# Patient Record
Sex: Male | Born: 1980 | Race: Black or African American | Hispanic: No | Marital: Single | Smoking: Never smoker
Health system: Southern US, Community
[De-identification: ages and names within clinical notes are randomized; demographics above are authoritative.]

---
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
## 2003-02-27 ENCOUNTER — Emergency Department (HOSPITAL_COMMUNITY): Admission: EM | Admit: 2003-02-27 | Discharge: 2003-02-28 | Payer: Self-pay | Admitting: Emergency Medicine

## 2004-02-06 ENCOUNTER — Emergency Department (HOSPITAL_COMMUNITY): Admission: EM | Admit: 2004-02-06 | Discharge: 2004-02-06 | Payer: Self-pay | Admitting: Emergency Medicine

## 2004-07-20 ENCOUNTER — Emergency Department (HOSPITAL_COMMUNITY): Admission: EM | Admit: 2004-07-20 | Discharge: 2004-07-21 | Payer: Self-pay | Admitting: Emergency Medicine

## 2005-01-14 ENCOUNTER — Emergency Department (HOSPITAL_COMMUNITY): Admission: EM | Admit: 2005-01-14 | Discharge: 2005-01-14 | Payer: Self-pay | Admitting: Emergency Medicine

## 2011-10-25 ENCOUNTER — Encounter (HOSPITAL_BASED_OUTPATIENT_CLINIC_OR_DEPARTMENT_OTHER): Payer: Self-pay | Admitting: Emergency Medicine

## 2011-10-25 ENCOUNTER — Emergency Department (HOSPITAL_BASED_OUTPATIENT_CLINIC_OR_DEPARTMENT_OTHER)
Admission: EM | Admit: 2011-10-25 | Discharge: 2011-10-26 | Disposition: A | Discharge status: Home / Self Care | Payer: Self-pay | Attending: Emergency Medicine | Admitting: Emergency Medicine

## 2011-10-25 DIAGNOSIS — IMO0002 Reserved for concepts with insufficient information to code with codable children: Secondary | ICD-10-CM | POA: Insufficient documentation

## 2011-10-25 DIAGNOSIS — F411 Generalized anxiety disorder: Secondary | ICD-10-CM | POA: Insufficient documentation

## 2011-10-25 DIAGNOSIS — R51 Headache: Secondary | ICD-10-CM | POA: Insufficient documentation

## 2011-10-25 DIAGNOSIS — S0101XA Laceration without foreign body of scalp, initial encounter: Secondary | ICD-10-CM

## 2011-10-25 DIAGNOSIS — S0100XA Unspecified open wound of scalp, initial encounter: Secondary | ICD-10-CM | POA: Insufficient documentation

## 2011-10-25 MED ORDER — LIDOCAINE HCL 2 % IJ SOLN
INTRAMUSCULAR | Status: AC
  Filled 2011-10-25: qty 1

## 2011-10-25 NOTE — ED Notes (Signed)
Pt was jumping in the garage and hit his head on the garage door apparatus in ceiling.  Approx 13 inch lac.  Denies LOC.  Ambulatory.  Bleeding controlled on arrival to Ed.

## 2011-10-26 MED ORDER — IBUPROFEN 800 MG PO TABS
800.0000 mg | ORAL_TABLET | Freq: Once | ORAL | Status: AC
  Administered 2011-10-26: 800 mg via ORAL

## 2011-10-26 MED ORDER — HYDROCODONE-ACETAMINOPHEN 5-325 MG PO TABS: 1.0000 | ORAL_TABLET | Freq: Four times a day (QID) | ORAL | Status: AC | PRN

## 2011-10-26 MED ORDER — HYDROCODONE-ACETAMINOPHEN 5-325 MG PO TABS: 1.0000 | ORAL_TABLET | Freq: Once | ORAL | Status: DC

## 2011-10-26 MED ORDER — IBUPROFEN 800 MG PO TABS
ORAL_TABLET | ORAL | Status: AC
  Filled 2011-10-26: qty 1

## 2011-10-26 NOTE — Discharge Instructions (Signed)
Laceration Care, Adult A laceration is a cut or lesion that goes through all layers of the skin and into the tissue just beneath the skin. TREATMENT  Some lacerations may not require closure. Some lacerations may not be able to be closed due to an increased risk of infection. It is important to see your caregiver as soon as possible after an injury to minimize the risk of infection and maximize the opportunity for successful closure. If closure is appropriate, pain medicines may be given, if needed. The wound will be cleaned to help prevent infection. Your caregiver will use stitches (sutures), staples, wound glue (adhesive), or skin adhesive strips to repair the laceration. These tools bring the skin edges together to allow for faster healing and a better cosmetic outcome. However, all wounds will heal with a scar. Once the wound has healed, scarring can be minimized by covering the wound with sunscreen during the day for 1 full year. HOME CARE INSTRUCTIONS  For sutures or staples:  Keep the wound clean and dry.   If you were given a bandage (dressing), you should change it at least once a day. Also, change the dressing if it becomes wet or dirty, or as directed by your caregiver.   Wash the wound with soap and water 2 times a day. Rinse the wound off with water to remove all soap. Pat the wound dry with a clean towel.   After cleaning, apply a thin layer of the antibiotic ointment as recommended by your caregiver. This will help prevent infection and keep the dressing from sticking.   You may shower as usual after the first 24 hours. Do not soak the wound in water until the sutures are removed.   Only take over-the-counter or prescription medicines for pain, discomfort, or fever as directed by your caregiver.   Get your sutures or staples removed as directed by your caregiver.  You may need a tetanus shot if:  You cannot remember when you had your last tetanus shot.   You have never had a  tetanus shot.  If you get a tetanus shot, your arm may swell, get red, and feel warm to the touch. This is common and not a problem. If you need a tetanus shot and you choose not to have one, there is a rare chance of getting tetanus. Sickness from tetanus can be serious. SEEK MEDICAL CARE IF:   You have redness, swelling, or increasing pain in the wound.   You see a red line that goes away from the wound.   You have yellowish-white fluid (pus) coming from the wound.   You have a fever.   You notice a bad smell coming from the wound or dressing.   Your wound breaks open before or after sutures have been removed.   You notice something coming out of the wound such as wood or glass.   Your wound is on your hand or foot and you cannot move a finger or toe.  SEEK IMMEDIATE MEDICAL CARE IF:   Your pain is not controlled with prescribed medicine.   You have severe swelling around the wound causing pain and numbness or a change in color in your arm, hand, leg, or foot.   Your wound splits open and starts bleeding.   You have worsening numbness, weakness, or loss of function of any joint around or beyond the wound.   You develop painful lumps near the wound or on the skin anywhere on your body.  MAKE  SURE YOU:   Understand these instructions.   Will watch your condition.   Will get help right away if you are not doing well or get worse.  Document Released: 06/19/2005 Document Revised: 06/08/2011 Document Reviewed: 12/13/2010 [facility] Patient Information 2012 [place], [place].

## 2011-10-26 NOTE — ED Provider Notes (Signed)
History     CSN: [id number]  Arrival date & time 10/25/11  2253   First MD Initiated Contact with Patient 10/26/11 0107      Chief Complaint  Patient presents with  . Head Laceration    (Consider location/radiation/quality/duration/timing/severity/associated sxs/prior treatment) HPI Is a 31 year old black male who was jumping up and describes to clear some spider webs and struck his head on the drug store opener. He has a laceration across the top of his scalp. There was heavy bleeding but this has resolved with pressure. The wound was irrigated by nursing staff prior to my evaluation. There's moderate pain associated with it. He denies neck pain. He denies loss of consciousness. He denies nausea or vomiting. He states his tetanus shots are up to date.  History reviewed. No pertinent past medical history.  History reviewed. No pertinent past surgical history.  No family history on file.  History  Substance Use Topics  . Smoking status: Never Smoker   . Smokeless tobacco: Not on file  . Alcohol Use: No      Review of Systems  All other systems reviewed and are negative.    Allergies  Review of patient's allergies indicates no known allergies.  Home Medications   Current Outpatient Rx  Name Route Sig Dispense Refill  . IBUPROFEN 200 MG PO TABS Oral Take 400 mg by mouth every 6 (six) hours as needed. Patient used this medication for headaches.      BP 142/95  Pulse 97  Temp(Src) 98.4 F (36.9 C) (Oral)  Resp 16  Ht 5\' 7"  (1.702 m)  Wt 165 lb (74.844 kg)  BMI 25.84 kg/m2  SpO2 98%  Physical Exam General: Well-developed, well-nourished male in no acute distress; appearance consistent with age of record HENT: normocephalic, scalp laceration Eyes: pupils equal round and reactive to light; extraocular muscles intact Neck: supple; nontender Heart: regular rate and rhythm Lungs: Normal respiratory effort and excursion Abdomen: soft; nondistended Extremities: No  deformity; full range of motion Neurologic: Awake, alert and oriented; motor function intact in all extremities and symmetric; no facial droop Skin: Warm and dry Psychiatric: Anxious    ED Course  Procedures (including critical care time)  LACERATION REPAIR Performed by: Malicia Blasdel L Authorized by: Hanley Seamen Consent: Verbal consent obtained. Risks and benefits: risks, benefits and alternatives were discussed Consent given by: patient Patient identity confirmed: provided demographic data Prepped and Draped in normal sterile fashion Wound explored  Laceration Location: Top of scalp  Laceration Length: 6.5 cm  No Foreign Bodies seen or palpated  Anesthesia: local infiltration  Local anesthetic: None (at patient's request due to fear of needles)  Irrigation method: syringe Amount of cleaning: standard  Skin closure:  staples  Number of sutures:  6  Patient tolerance: Patient tolerated the procedure well with no immediate complications.    MDM          Hanley Seamen, MD 10/26/11 [phone]

## 2011-11-04 ENCOUNTER — Encounter (HOSPITAL_BASED_OUTPATIENT_CLINIC_OR_DEPARTMENT_OTHER): Payer: Self-pay | Admitting: *Deleted

## 2011-11-04 ENCOUNTER — Emergency Department (HOSPITAL_BASED_OUTPATIENT_CLINIC_OR_DEPARTMENT_OTHER)
Admission: EM | Admit: 2011-11-04 | Discharge: 2011-11-04 | Disposition: A | Discharge status: Home / Self Care | Payer: Self-pay | Attending: Emergency Medicine | Admitting: Emergency Medicine

## 2011-11-04 DIAGNOSIS — Z4802 Encounter for removal of sutures: Secondary | ICD-10-CM | POA: Insufficient documentation

## 2011-11-04 NOTE — Discharge Instructions (Signed)
You should return to the ER if you develop pain, fever, drainage of pus or spreading redness at site of wound. Staple Removal Care After The staples used to close your skin have been removed. The wound needs continued care so it can heal completely and without problems. The care described here will need to be done for another 5-10 days unless your caregiver advises otherwise.  HOME CARE INSTRUCTIONS   Keep wound site dry and clean.   If skin adhesive strips were applied after the staples were removed, they will begin to peel off in a few days. If they remain after fourteen days, they may be peeled off and discarded.   If you still have a dressing, change it at least once a day or as instructed by your caregiver. If the bandage sticks, soak it off with warm water. Pat dry with a clean towel. Look for signs of infection (see below).   Reapply cream or ointment according to your caregiver's instruction. This will help prevent infection and keep the bandage from sticking. Use of a non-stick material over the wound and under the dressing or wrap will also help keep the bandage from sticking.   If the bandage becomes wet, dirty or develops a foul smell, change it as soon as possible.   New scars become sunburned easily. Use sunscreens with protection factor (SPF) of at least 15 when out in the sun.   Only take over-the-counter or prescription medicines for pain, discomfort or fever as directed by your caregiver.  SEEK IMMEDIATE MEDICAL CARE IF:   There is redness, swelling or increasing pain in the wound.   Pus is coming from the wound.   An unexplained oral temperature above 102 F (38.9 C) develops.   You notice a foul smell coming from the wound or dressing.   There is a breaking open of the suture line (edges not staying together) of the wound edges after staples have been removed.  Document Released: 06/01/2008 Document Revised: 06/08/2011 Document Reviewed: 06/01/2008 [facility] Patient  Information 2012 [place], [place].
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
## 2011-11-04 NOTE — ED Notes (Signed)
Pt seen here 4-24. Here for staple removal.

## 2011-11-04 NOTE — ED Provider Notes (Signed)
Medical screening examination/treatment/procedure(s) were performed by non-physician practitioner and as supervising physician I was immediately available for consultation/collaboration.   Geoffery Lyons, MD 11/04/11 1450

## 2014-08-01 ENCOUNTER — Emergency Department (HOSPITAL_COMMUNITY)
Admission: EM | Admit: 2014-08-01 | Discharge: 2014-08-01 | Disposition: A | Discharge status: Home / Self Care | Payer: No Typology Code available for payment source | Attending: Emergency Medicine | Admitting: Emergency Medicine

## 2014-08-01 ENCOUNTER — Encounter (HOSPITAL_COMMUNITY): Payer: Self-pay | Admitting: Emergency Medicine

## 2014-08-01 ENCOUNTER — Emergency Department (HOSPITAL_COMMUNITY): Payer: No Typology Code available for payment source

## 2014-08-01 DIAGNOSIS — Y998 Other external cause status: Secondary | ICD-10-CM | POA: Insufficient documentation

## 2014-08-01 DIAGNOSIS — Y9389 Activity, other specified: Secondary | ICD-10-CM | POA: Diagnosis not present

## 2014-08-01 DIAGNOSIS — Y92410 Unspecified street and highway as the place of occurrence of the external cause: Secondary | ICD-10-CM | POA: Insufficient documentation

## 2014-08-01 DIAGNOSIS — S0990XA Unspecified injury of head, initial encounter: Secondary | ICD-10-CM | POA: Diagnosis not present

## 2014-08-01 DIAGNOSIS — S199XXA Unspecified injury of neck, initial encounter: Secondary | ICD-10-CM | POA: Diagnosis present

## 2014-08-01 DIAGNOSIS — S161XXA Strain of muscle, fascia and tendon at neck level, initial encounter: Secondary | ICD-10-CM | POA: Diagnosis not present

## 2014-08-01 MED ORDER — CYCLOBENZAPRINE HCL 10 MG PO TABS: 10.0000 mg | ORAL_TABLET | Freq: Two times a day (BID) | ORAL | Status: DC | PRN

## 2014-08-01 MED ORDER — IBUPROFEN 800 MG PO TABS: 800.0000 mg | ORAL_TABLET | Freq: Three times a day (TID) | ORAL | Status: DC

## 2014-08-01 MED ORDER — ACETAMINOPHEN 325 MG PO TABS
650.0000 mg | ORAL_TABLET | Freq: Once | ORAL | Status: AC
  Administered 2014-08-01: 650 mg via ORAL
  Filled 2014-08-01: qty 2

## 2014-08-01 MED ORDER — HYDROCODONE-ACETAMINOPHEN 5-325 MG PO TABS
1.0000 | ORAL_TABLET | Freq: Once | ORAL | Status: DC
  Filled 2014-08-01: qty 1
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
## 2014-08-01 NOTE — ED Notes (Signed)
Patient transported to X-ray 
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
## 2014-08-10 ENCOUNTER — Emergency Department (HOSPITAL_BASED_OUTPATIENT_CLINIC_OR_DEPARTMENT_OTHER)
Admission: EM | Admit: 2014-08-10 | Discharge: 2014-08-10 | Disposition: A | Discharge status: Home / Self Care | Payer: No Typology Code available for payment source | Attending: Emergency Medicine | Admitting: Emergency Medicine

## 2014-08-10 ENCOUNTER — Encounter (HOSPITAL_BASED_OUTPATIENT_CLINIC_OR_DEPARTMENT_OTHER): Payer: Self-pay

## 2014-08-10 ENCOUNTER — Encounter: Payer: Self-pay | Admitting: Medical

## 2014-08-10 DIAGNOSIS — G8911 Acute pain due to trauma: Secondary | ICD-10-CM | POA: Diagnosis not present

## 2014-08-10 DIAGNOSIS — M542 Cervicalgia: Secondary | ICD-10-CM | POA: Diagnosis present

## 2014-08-10 DIAGNOSIS — M549 Dorsalgia, unspecified: Secondary | ICD-10-CM | POA: Insufficient documentation

## 2014-08-10 DIAGNOSIS — M62838 Other muscle spasm: Secondary | ICD-10-CM | POA: Insufficient documentation

## 2014-08-10 MED ORDER — IBUPROFEN 800 MG PO TABS: 800.0000 mg | ORAL_TABLET | Freq: Three times a day (TID) | ORAL | Status: DC

## 2014-08-10 MED ORDER — CYCLOBENZAPRINE HCL 10 MG PO TABS: 10.0000 mg | ORAL_TABLET | Freq: Two times a day (BID) | ORAL | Status: DC | PRN

## 2014-08-10 NOTE — Discharge Instructions (Signed)
Muscle Cramps and Spasms °Muscle cramps and spasms occur when a muscle or muscles tighten and you have no control over this tightening (involuntary muscle contraction). They are a common problem and can develop in any muscle. The most common place is in the calf muscles of the leg. Both muscle cramps and muscle spasms are involuntary muscle contractions, but they also have differences:  °· Muscle cramps are sporadic and painful. They may last a few seconds to a quarter of an hour. Muscle cramps are often more forceful and last longer than muscle spasms. °· Muscle spasms may or may not be painful. They may also last just a few seconds or much longer. °CAUSES  °It is uncommon for cramps or spasms to be due to a serious underlying problem. In many cases, the cause of cramps or spasms is unknown. Some common causes are:  °· Overexertion.   °· Overuse from repetitive motions (doing the same thing over and over).   °· Remaining in a certain position for a long period of time.   °· Improper preparation, form, or technique while performing a sport or activity.   °· Dehydration.   °· Injury.   °· Side effects of some medicines.   °· Abnormally low levels of the salts and ions in your blood (electrolytes), especially potassium and calcium. This could happen if you are taking water pills (diuretics) or you are pregnant.   °Some underlying medical problems can make it more likely to develop cramps or spasms. These include, but are not limited to:  °· Diabetes.   °· Parkinson disease.   °· Hormone disorders, such as thyroid problems.   °· Alcohol abuse.   °· Diseases specific to muscles, joints, and bones.   °· Blood vessel disease where not enough blood is getting to the muscles.   °HOME CARE INSTRUCTIONS  °· Stay well hydrated. Drink enough water and fluids to keep your urine clear or pale yellow. °· It may be helpful to massage, stretch, and relax the affected muscle. °· For tight or tense muscles, use a warm towel, heating  pad, or hot shower water directed to the affected area. °· If you are sore or have pain after a cramp or spasm, applying ice to the affected area may relieve discomfort. °¨ Put ice in a plastic bag. °¨ Place a towel between your skin and the bag. °¨ Leave the ice on for 15-20 minutes, 03-04 times a day. °· Medicines used to treat a known cause of cramps or spasms may help reduce their frequency or severity. Only take over-the-counter or prescription medicines as directed by your caregiver. °SEEK MEDICAL CARE IF:  °Your cramps or spasms get more severe, more frequent, or do not improve over time.  °MAKE SURE YOU:  °· Understand these instructions. °· Will watch your condition. °· Will get help right away if you are not doing well or get worse. °Document Released: 12/09/2001 Document Revised: 10/14/2012 Document Reviewed: 06/05/2012 °ExitCare® Patient Information ©2015 [employer]. This information is not intended to replace advice given to you by your health care provider. Make sure you discuss any questions you have with your health care provider. ° ° °Emergency Department Resource Guide °1) Find a Doctor and Pay Out of Pocket °Although you won't have to find out who is covered by your insurance plan, it is a good idea to ask around and get recommendations. You will then need to call the office and see if the doctor you have chosen will accept you as a new patient and what types of options they offer   for patients who are self-pay. Some doctors offer discounts or will set up payment plans for their patients who do not have insurance, but you will need to ask so you aren't surprised when you get to your appointment. ° °2) Contact Your Local Health Department °Not all health departments have doctors that can see patients for sick visits, but many do, so it is worth a call to see if yours does. If you don't know where your local health department is, you can check in your phone book. The CDC also has a tool to help  you locate your state's health department, and many state websites also have listings of all of their local health departments. ° °3) Find a Walk-in Clinic °If your illness is not likely to be very severe or complicated, you may want to try a walk in clinic. These are popping up all over the country in pharmacies, drugstores, and shopping centers. They're usually staffed by nurse practitioners or physician assistants that have been trained to treat common illnesses and complaints. They're usually fairly quick and inexpensive. However, if you have serious medical issues or chronic medical problems, these are probably not your best option. ° °No Primary Care Doctor: °- Call [facility] at  832-8000 - they can help you locate a primary care doctor that  accepts your insurance, provides certain services, etc. °- Physician Referral Service- 1-800-533-3463 ° °Chronic Pain Problems: °Organization         Address  Phone   Notes  °[facility]  (336) 297-2271 Patients need to be referred by their primary care doctor.  ° °Medication Assistance: °Organization         Address  Phone   Notes  °[place] [facility] [address] °[place], [place] [address] (336) 641-8030 --Must be a resident of [place] °-- Must have NO insurance coverage whatsoever (no Medicaid/ Medicare, etc.) °-- The pt. MUST have a primary care doctor that directs their care regularly and follows them in the community °  °[facility]  (866) 331-1348   °[employer]  (888) 892-1162   ° °Agencies that provide inexpensive medical care: °Organization         Address  Phone   Notes  °[facility]  (336) 832-8035   °[facility]    (336) 832-7272   °[facility] [address] °[place], [place] [address] (336) 832-4777   °[facility] [address], °[place] (336) 271-4999   °[facility]    (336) 373-0678   °[facility]    (336) 272-1050   °[facility] ° [address], [place] Phone:  (336) 832-4444, Fax:  (336) 832-4440 Hours of Operation:  9 am - 6 pm, M-F.  Also accepts Medicaid/Medicare and self-pay.  ° [facility] ° [address], [place] Phone: (336) 832-3150, Fax: (336) 832-3151. Hours of Operation:  8:30 am - 5:30 pm, M-F.  Also accepts Medicaid and self-pay.  °[facility] [place] [address], [place] Phone: (336) 878-6027   °[facility] [address], [place], [place] (336)723-1848, Ext. 123 Mondays & Thursdays: 7-9 AM.  First 15 patients are seen on a first come, first serve basis. °  ° °Medicaid-accepting [place] Providers: ° °Organization         Address  Phone   Notes  °[facility]   [address], [place] (336) 641-2100 Also accepts self-pay patients.  °[facility] [address], [place] ° (336) 856-9996   °[facility] [address], [place] (336) 288-8857   °[facility] [address] [place] [address], [place] (336) 299-7000   °Veita Bland [address], [place]  ° (336) 373-1557 Only accepts [place] Access Medicaid patients after they have their name applied to their card.  ° °Self-Pay (no insurance) in [place]: ° °Organization         Address  Phone   Notes  °Sickle Cell Patients, [facility] [address], [place] (336) 832-1970   °[facility] [address], [place] (336) 832-4400   °[facility] ° [address], [place] (336) 992-4800   °[facility]/Dr. Osei-Bonsu ° [address] [place] [address], [place] or [address], [place] (336) 841-8500 Phone number for both [place] and [place] locations is the same.  °[facility] [address],  [place] (336) 299-0000   °[facility] [address] [place] [address], [place] or [address] (336) 852-7530 °(336) 878-2260   °[facility] [address], [place] (336) 350-1642, phone; (336) 294-5005, fax Sees patients 1st and 3rd Saturday of every month.  Must not qualify for public or private insurance (i.e. Medicaid, Medicare, [place] Health Choice, Veterans' Benefits) • Household income should be no more than 200% of the poverty level •The clinic cannot treat you if you are pregnant or think you are pregnant • Sexually transmitted diseases are not treated at the clinic.  ° ° °Dental Care: °Organization         Address  Phone  Notes  °[facility] [facility] [address], [place] (336) 641-6152 Accepts children up to age 21 who are enrolled in Medicaid or [place] Health Choice; pregnant women with a Medicaid card; and children who have applied for Medicaid or [place] Health Choice, but were declined, whose parents can pay a reduced fee at time of service.  °[facility] [place]  [address], [place] (336) 641-7733 Accepts children up to age 21 who are enrolled in Medicaid or [place] Health Choice; pregnant women with a Medicaid card; and children who have applied for Medicaid or [place] Health Choice, but were declined, whose parents can pay a reduced fee at time of service.  °[facility] ° [address], [place] (336) 641-4533 Patients are seen by appointment only. Walk-ins are not accepted. [facility] will see patients 18 years of age and older. °Monday - Tuesday (8am-5pm) °Most Wednesdays (8:30-5pm) °$30 per visit, cash only  °[facility] ° [address], [place] (336) 641-4533 Patients are seen by appointment only. Walk-ins are not accepted. [facility] will see patients 18 years of age and older. °One Wednesday Evening  (Monthly: Volunteer Based).  $30 per visit, cash only  °[facility]  (919) 537-3737 for adults; Children under age 4, call Graduate Pediatric Dentistry at (919) [phone]. Children aged 4-14, please call (919) [phone] to request a pediatric application. ° Dental services are provided in all areas of dental care including fillings, crowns and bridges, complete and partial dentures, implants, gum   treatment, root canals, and extractions. Preventive care is also provided. Treatment is provided to both adults and children. °Patients are selected via a lottery and there is often a waiting list. °  °[facility] [address], °[place] ° (336) 763-8833 [email or web address] °  °[facility] [address], [place], [place] (336)723-1848, Ext. 123 Second and Fourth Thursday of each month, opens at 6:30 AM; Clinic ends at 9 AM.  Patients are seen on a first-come first-served basis, and a limited number are seen during each clinic.  ° °[facility] ° [address], [place], [place] (336) 723-7904   Eligibility Requirements °You must have lived in [place], [place], or [place] counties for at least the last three months. °  You cannot be eligible for state or federal sponsored healthcare insurance, including Veterans Administration, Medicaid, or Medicare. °  You generally cannot be eligible for healthcare insurance through your employer.  °  How to apply: °Eligibility screenings are held every Tuesday and Wednesday afternoon from 1:00 pm until 4:00 pm. You do not need an appointment for the interview!  °[facility] [address], [place], [place] 336-631-2330   °[facility]  336-342-8273   °[facility]  336-703-3100   °[facility]  336-570-6415   ° °Behavioral Health Resources in the Community: °Intensive Outpatient Programs °Organization         Address  Phone  Notes  °[place]  [facility] [address], [place], [place] 336-878-6098   °[facility] [address], [place], [place] 336-832-9800   °[facility] [address], [place], [place] ° 336-882-2125   °[facility] [address],  °[place], [place] 1-800-853-5163 or 336-641-4981   °Substance Abuse Resources °Organization         Address  Phone  Notes  °[facility]  336-882-2125   °[facility]  336-784-9470   °[facility]  336-285-9073   °[facility]  336-845-3988   °Residential & Outpatient Substance Abuse Program  1-800-659-3381   °Psychological Services °Organization         Address  Phone  Notes  ° Health  336- 832-9600   °[facility]  336- 378-7881   °[facility] [address], [place] 1-800-853-5163 or 336-641-4981   ° °Mobile Crisis Teams °Organization         Address  Phone  Notes  °[facility], Mobile Crisis Care Unit  1-877-626-1772   °Assertive °[facility] ° [address] [place], [place] 336-834-9664   °Sharon DeEsch [address] °[place] 336-554-5454   ° °Self-Help/Support Groups °Organization         Address  Phone             Notes  °[facility] [employer] - variety of support groups  336- 373-1402 Call for more information  °Narcotics Anonymous (NA), [facility] [address], °[place]  2 meetings at this location  ° °Residential Treatment Programs °Organization         Address  Phone  Notes  °[facility] [address],    ° [place]  1-866-801-8205   °[facility] ° [address], [place],  704-293-8524   °[facility] [address], [place] 336-845-3988 Admissions: 8am-3pm M-F  °[facility] [address]    °[place], [place] 336-841-1104   °[facility] [address],  [place], [place] 336-379-7146   °[facility] [address]  °[place], [place] 336-285-9073   °[facility] Intensive Outpatient [address], [place], [place] 336-852-3033   °[facility] [address]  °[place], [place] 1-877-615-2722 or 336-784-9470   °[facility] [address] [place], [place] 336-227-7417 Accepts Medicaid  °[facility] [address]  °[place] 1-800-659-3381 Substance Abuse/Addiction Treatment  ° °[place] Behavioral Health Resources °Organization         Address  Phone  Notes  °[facility]  (888) 581-9988   °Julie Brannon, PhD [address] [place], [place]   (336) 349-5553 or (336) 951-0000   °[facility]   [address] °[place], [place] (336) 349-4454   °[facility] [address], [place], [place] (336) 342-8316 Insurance/Medicaid/sponsorship through [facility]  °[facility] [address]                                    [place], [place] (336) 342-8316 Therapy/tele-psych/case  °[facility] [address]  ° [place], [place] (336) 349-2233    °Dr. Arfeen  (336) 349-4544   °[facility] [place]  [employer] [place] [facility] 1) [address], [place] °2) [address], [place] °3)  [address], [place] (336) 349-3220 °(336) 342-7768 ° °(336) 342-8140   °[place] [facility] (336) 342-1394 or (336) 342-3537 (After Hours)    ° ° ° °
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
## 2014-08-10 NOTE — Progress Notes (Signed)
This encounter was created in error - please disregard.
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
## 2017-07-29 ENCOUNTER — Emergency Department (HOSPITAL_COMMUNITY): Admission: EM | Admit: 2017-07-29 | Discharge: 2017-07-29 | Discharge status: Against Medical Advice | Payer: Self-pay

## 2017-07-29 ENCOUNTER — Emergency Department (HOSPITAL_COMMUNITY)
Admission: EM | Admit: 2017-07-29 | Discharge: 2017-07-30 | Disposition: A | Discharge status: Home / Self Care | Payer: Self-pay | Attending: Emergency Medicine | Admitting: Emergency Medicine

## 2017-07-29 ENCOUNTER — Encounter (HOSPITAL_COMMUNITY): Payer: Self-pay | Admitting: Nurse Practitioner

## 2017-07-29 ENCOUNTER — Emergency Department (HOSPITAL_COMMUNITY): Payer: Self-pay

## 2017-07-29 DIAGNOSIS — N134 Hydroureter: Secondary | ICD-10-CM | POA: Insufficient documentation

## 2017-07-29 DIAGNOSIS — N201 Calculus of ureter: Secondary | ICD-10-CM | POA: Insufficient documentation

## 2017-07-29 DIAGNOSIS — N130 Hydronephrosis with ureteropelvic junction obstruction: Secondary | ICD-10-CM | POA: Insufficient documentation

## 2017-07-29 DIAGNOSIS — N50819 Testicular pain, unspecified: Secondary | ICD-10-CM

## 2017-07-29 DIAGNOSIS — N132 Hydronephrosis with renal and ureteral calculous obstruction: Secondary | ICD-10-CM

## 2017-07-29 LAB — CBC WITH DIFFERENTIAL/PLATELET
BASOS PCT: 0 %
Basophils Absolute: 0 10*3/uL (ref 0.0–0.1)
Eosinophils Absolute: 0 10*3/uL (ref 0.0–0.7)
Eosinophils Relative: 0 %
HEMATOCRIT: 46.3 % (ref 39.0–52.0)
HEMOGLOBIN: 15.9 g/dL (ref 13.0–17.0)
LYMPHS ABS: 0.9 10*3/uL (ref 0.7–4.0)
Lymphocytes Relative: 7 %
MCH: 29.9 pg (ref 26.0–34.0)
MCHC: 34.3 g/dL (ref 30.0–36.0)
MCV: 87.2 fL (ref 78.0–100.0)
Monocytes Absolute: 0.5 10*3/uL (ref 0.1–1.0)
Monocytes Relative: 3 %
NEUTROS PCT: 90 %
Neutro Abs: 12.4 10*3/uL — ABNORMAL HIGH (ref 1.7–7.7)
Platelets: 230 10*3/uL (ref 150–400)
RBC: 5.31 MIL/uL (ref 4.22–5.81)
RDW: 13.7 % (ref 11.5–15.5)
WBC: 13.8 10*3/uL — AB (ref 4.0–10.5)

## 2017-07-29 LAB — URINALYSIS, COMPLETE (UACMP) WITH MICROSCOPIC
BACTERIA UA: NONE SEEN
Bilirubin Urine: NEGATIVE
Glucose, UA: NEGATIVE mg/dL
Ketones, ur: 20 mg/dL — AB
Leukocytes, UA: NEGATIVE
NITRITE: NEGATIVE
Protein, ur: 30 mg/dL — AB
SPECIFIC GRAVITY, URINE: 1.024 (ref 1.005–1.030)
SQUAMOUS EPITHELIAL / LPF: NONE SEEN
pH: 7 (ref 5.0–8.0)

## 2017-07-29 LAB — COMPREHENSIVE METABOLIC PANEL
ALT: 25 U/L (ref 17–63)
AST: 29 U/L (ref 15–41)
Albumin: 4.7 g/dL (ref 3.5–5.0)
Alkaline Phosphatase: 85 U/L (ref 38–126)
Anion gap: 10 (ref 5–15)
BUN: 16 mg/dL (ref 6–20)
CO2: 24 mmol/L (ref 22–32)
CREATININE: 1.65 mg/dL — AB (ref 0.61–1.24)
Calcium: 9.6 mg/dL (ref 8.9–10.3)
Chloride: 106 mmol/L (ref 101–111)
GFR calc Af Amer: >60 mL/min (ref >60)
GFR calc non Af Amer: 52 mL/min — ABNORMAL LOW (ref >60)
Glucose, Bld: 136 mg/dL — ABNORMAL HIGH (ref 65–99)
Potassium: 4.6 mmol/L (ref 3.5–5.1)
Sodium: 140 mmol/L (ref 135–145)
TOTAL PROTEIN: 8.1 g/dL (ref 6.5–8.1)
Total Bilirubin: 0.4 mg/dL (ref 0.3–1.2)

## 2017-07-29 LAB — LIPASE, BLOOD: Lipase: 23 U/L (ref 11–51)

## 2017-07-29 MED ORDER — KETOROLAC TROMETHAMINE 30 MG/ML IJ SOLN: 30.0000 mg | Freq: Once | INTRAMUSCULAR | Status: DC

## 2017-07-29 MED ORDER — IOPAMIDOL (ISOVUE-300) INJECTION 61%
100.0000 mL | Freq: Once | INTRAVENOUS | Status: AC | PRN
  Administered 2017-07-29: 100 mL via INTRAVENOUS

## 2017-07-29 MED ORDER — HYDROMORPHONE HCL 1 MG/ML IJ SOLN
1.0000 mg | Freq: Once | INTRAMUSCULAR | Status: DC
  Filled 2017-07-29: qty 1

## 2017-07-29 MED ORDER — HYDROMORPHONE HCL 1 MG/ML IJ SOLN
1.0000 mg | Freq: Once | INTRAMUSCULAR | Status: AC
  Administered 2017-07-29: 1 mg via INTRAVENOUS

## 2017-07-29 MED ORDER — FENTANYL CITRATE (PF) 100 MCG/2ML IJ SOLN
50.0000 ug | Freq: Once | INTRAMUSCULAR | Status: DC
  Filled 2017-07-29: qty 2

## 2017-07-29 MED ORDER — KETOROLAC TROMETHAMINE 30 MG/ML IJ SOLN
30.0000 mg | Freq: Once | INTRAMUSCULAR | Status: AC
  Administered 2017-07-30: 30 mg via INTRAVENOUS
  Filled 2017-07-29: qty 1

## 2017-07-29 MED ORDER — ONDANSETRON HCL 4 MG/2ML IJ SOLN
4.0000 mg | Freq: Once | INTRAMUSCULAR | Status: AC
  Administered 2017-07-29: 4 mg via INTRAVENOUS
  Filled 2017-07-29: qty 2

## 2017-07-29 MED ORDER — FENTANYL CITRATE (PF) 100 MCG/2ML IJ SOLN
100.0000 ug | Freq: Once | INTRAMUSCULAR | Status: AC
Start: 2017-07-29 — End: 2017-07-29
  Administered 2017-07-29: 100 ug via INTRAVENOUS
  Filled 2017-07-29: qty 2

## 2017-07-29 MED ORDER — HYDROMORPHONE HCL 1 MG/ML IJ SOLN
0.5000 mg | Freq: Once | INTRAMUSCULAR | Status: AC
  Administered 2017-07-29: 0.5 mg via INTRAVENOUS
  Filled 2017-07-29: qty 1

## 2017-07-29 MED ORDER — IOPAMIDOL (ISOVUE-300) INJECTION 61%
INTRAVENOUS | Status: AC
  Filled 2017-07-29: qty 100

## 2017-07-29 NOTE — ED Notes (Signed)
Scrotal ultrasound complete

## 2017-07-29 NOTE — ED Notes (Signed)
Pt still unable to provide urine specimen 

## 2017-07-29 NOTE — ED Notes (Signed)
Patient transported to CT 

## 2017-07-29 NOTE — ED Triage Notes (Signed)
Pt is presenting with some pain induced distress, pointing to his genitals, he states he "feels as if he is on fire." States the pain is moving to his lower stomach pointing to his suprapubic region. Denies dysuria, endorses recent sexual activity/intercourse, states he first noticed the pain a week ago, went away but came back today in a worse state.

## 2017-07-29 NOTE — ED Provider Notes (Signed)
Kidney stone Slightly elevated Cr Dilaudid without improvement Fentanyl  Scrotal US pending for testicular tenderness  Plan: review US, reassess pain management  Pain is improved. Patient is resting quietly. VSS. Scrotal US negative. Discussed discharge home with pain management and Zofran for nausea. Return precautions outlined. Urology referral provided.    Darrell Gonzalez, Darrell Shadoan, PA-C 07/30/17 0105    Raeford RazorKohut, Stephen, MD 08/06/17 [phone]

## 2017-07-29 NOTE — ED Notes (Signed)
Pt was having increasing pain again.  Pt was on all 4s in the bed and vomited x1.  Pain medication given.  Pt states that while earlier the pain was 10/10 it is now 4/10.  Pt still appears quite uncomfortable at this time

## 2017-07-29 NOTE — ED Notes (Signed)
Pt has attempted to void but has been unsuccessful

## 2017-07-29 NOTE — ED Notes (Signed)
Patient called to triage 4. Went in room to triage, could not find patient.

## 2017-07-29 NOTE — ED Triage Notes (Signed)
Patient here via EMS with complaints of abdominal pain radiating down into testicles. EMS states that patient was throwing shoes in the back of the truck and trying to unbuckle self. Patient laying on the floor in triage and screaming.

## 2017-07-29 NOTE — ED Provider Notes (Signed)
[facility]-EMERGENCY DEPT Provider Note   CSN: [id number] Arrival date & time: 07/29/17  1840     History   Chief Complaint No chief complaint on file.   HPI Darrell Gonzalez is a 37 y.o. male who presents for severe testicle pain.  Patient states that about 2 weeks ago he had sudden onset of severe pain in both of his testicles lasting approximately 1 hour.  It called EMS but by the time they arrived it had resolved and did not return until this morning around 11 AM.  He had again sudden onset of severe bilateral testicle pain radiating into his suprapubic region.  He states that it is the worst pain he is ever had in his life and he had associated diaphoresis.  He states it is both of his testicles.  He denies any penile discharge, urinary symptoms.  He has bloating and distention in his lower abdomen with severe pain.  He denies flank pain, fevers, shortness of breath.  He has no previous history of kidney stones.  HPI  History reviewed. No pertinent past medical history.  There are no active problems to display for this patient.   History reviewed. No pertinent surgical history.     Home Medications    Prior to Admission medications   Medication Sig Start Date End Date Taking? Authorizing Provider  Aspirin-Salicylamide-Caffeine (BC HEADACHE POWDER PO) Take 1 packet by mouth as needed.   Yes [provider]  cyclobenzaprine (FLEXERIL) 10 MG tablet Take 1 tablet (10 mg total) by mouth 2 (two) times daily as needed for muscle spasms. Patient not taking: Reported on 07/29/2017 08/10/14   Elwin Mocha, MD  ibuprofen (ADVIL,MOTRIN) 800 MG tablet Take 1 tablet (800 mg total) by mouth 3 (three) times daily. Patient not taking: Reported on 07/29/2017 08/10/14   Elwin Mocha, MD    Family History History reviewed. No pertinent family history.  Social History Social History   Tobacco Use  . Smoking status: Never Smoker  Substance Use Topics  . Alcohol  use: No  . Drug use: No     Allergies   Patient has no known allergies.   Review of Systems Review of Systems Ten systems reviewed and are negative for acute change, except as noted in the HPI.   Physical Exam Updated Vital Signs BP 116/80 (BP Location: Left Arm)   Pulse 92   Temp 98.3 F (36.8 C) (Oral)   Resp 20   SpO2 99%   Physical Exam  Constitutional: He appears well-developed and well-nourished. No distress.  Appears to be in severe pain  HENT:  Head: Normocephalic and atraumatic.  Eyes: Conjunctivae and EOM are normal. Pupils are equal, round, and reactive to light. No scleral icterus.  Neck: Normal range of motion. Neck supple.  Cardiovascular: Normal rate, regular rhythm and normal heart sounds.  Pulmonary/Chest: Effort normal and breath sounds normal. No respiratory distress.  Abdominal: Soft. He exhibits distension. There is tenderness.  Lower abdomen is distended up to the umbilicus, firm and tender to palpation.  Genitourinary: Penis normal.  Genitourinary Comments: Male anatomy circumcised, no testicular swelling, bilateral testicles are exquisitely tender to palpation.  Penile discharge or lesions  Musculoskeletal: He exhibits no edema.  Neurological: He is alert.  Skin: Skin is warm. He is diaphoretic.  Psychiatric: His behavior is normal.  Nursing note and vitals reviewed.    ED Treatments / Results  Labs (all labs ordered are listed, but only abnormal results are displayed)  Labs Reviewed  CBC WITH DIFFERENTIAL/PLATELET - Abnormal; Notable for the following components:      Result Value   WBC 13.8 (*)    Neutro Abs 12.4 (*)    All other components within normal limits  COMPREHENSIVE METABOLIC PANEL  LIPASE, BLOOD  URINALYSIS, COMPLETE (UACMP) WITH MICROSCOPIC    EKG  EKG Interpretation None       Radiology No results found.  Procedures Procedures (including critical care time)  Medications Ordered in ED Medications  fentaNYL  (SUBLIMAZE) injection 100 mcg (100 mcg Intravenous Given 07/29/17 2041)  ondansetron (ZOFRAN) injection 4 mg (4 mg Intravenous Given 07/29/17 2042)  HYDROmorphone (DILAUDID) injection 1 mg (1 mg Intravenous Given 07/29/17 2054)     Initial Impression / Assessment and Plan / ED Course  I have reviewed the triage vital signs and the nursing notes.  Pertinent labs & imaging results that were available during my care of the patient were reviewed by me and considered in my medical decision making (see chart for details).  Clinical Course as of Jul 29 2340  Wynelle LinkSun Jul 29, 2017  2257 Creatinine noted to be elevvated  Creatinine: (!) 1.65 [AH]  2301 WBC: (!) 13.8 [AH]  2332 I spoke with Dr. Juleen ChinaKohut who feels an aching give Toradol despite mild elevation of his creatinine which is likely transient.  I reviewed his urinalysis which does not show any evidence of infection.  Patient's pain poorly controlled at this time.  [AH]    Clinical Course User Index [AH] Arthor CaptainHarris, Kaya Klausing, PA-C    Patient with kidney stone.  Patient pain is still poorly controlled at this time.  I have given sign out to PA up still who will assume care of the patient.  Feel he may be discharged if his pain is better controlled and may follow-up for repeat labs to check his creatinine.  Final Clinical Impressions(s) / ED Diagnoses   Final diagnoses:  Testicular pain    ED Discharge Orders    None       Arthor CaptainHarris, Kasheem Toner, PA-C 07/29/17 2343    Raeford RazorKohut, Stephen, MD 07/29/17 2356

## 2017-07-30 MED ORDER — IBUPROFEN 600 MG PO TABS: 600.0000 mg | ORAL_TABLET | Freq: Four times a day (QID) | ORAL | 0 refills | Status: DC | PRN

## 2017-07-30 MED ORDER — OXYCODONE-ACETAMINOPHEN 5-325 MG PO TABS: 1.0000 | ORAL_TABLET | ORAL | 0 refills | Status: DC | PRN

## 2017-07-30 MED ORDER — ONDANSETRON HCL 4 MG PO TABS: 4.0000 mg | ORAL_TABLET | Freq: Four times a day (QID) | ORAL | 0 refills | Status: DC

## 2018-08-16 ENCOUNTER — Encounter (HOSPITAL_COMMUNITY): Payer: Self-pay | Admitting: Emergency Medicine

## 2018-08-16 ENCOUNTER — Other Ambulatory Visit: Payer: Self-pay

## 2018-08-16 ENCOUNTER — Emergency Department (HOSPITAL_COMMUNITY)
Admission: EM | Admit: 2018-08-16 | Discharge: 2018-08-17 | Disposition: A | Discharge status: Home / Self Care | Payer: Self-pay | Attending: Emergency Medicine | Admitting: Emergency Medicine

## 2018-08-16 DIAGNOSIS — M542 Cervicalgia: Secondary | ICD-10-CM | POA: Insufficient documentation

## 2018-08-16 DIAGNOSIS — Z79899 Other long term (current) drug therapy: Secondary | ICD-10-CM | POA: Insufficient documentation

## 2018-08-16 DIAGNOSIS — Y998 Other external cause status: Secondary | ICD-10-CM | POA: Insufficient documentation

## 2018-08-16 DIAGNOSIS — Y939 Activity, unspecified: Secondary | ICD-10-CM | POA: Insufficient documentation

## 2018-08-16 DIAGNOSIS — M5489 Other dorsalgia: Secondary | ICD-10-CM | POA: Insufficient documentation

## 2018-08-16 DIAGNOSIS — Y92410 Unspecified street and highway as the place of occurrence of the external cause: Secondary | ICD-10-CM | POA: Insufficient documentation
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
## 2018-08-17 ENCOUNTER — Other Ambulatory Visit: Payer: Self-pay

## 2018-08-17 MED ORDER — CYCLOBENZAPRINE HCL 10 MG PO TABS: 10.0000 mg | ORAL_TABLET | Freq: Two times a day (BID) | ORAL | 0 refills | Status: DC | PRN

## 2018-08-17 MED ORDER — IBUPROFEN 600 MG PO TABS: 600.0000 mg | ORAL_TABLET | Freq: Four times a day (QID) | ORAL | 0 refills | Status: DC | PRN

## 2018-08-17 NOTE — ED Notes (Signed)
E-signature not available, verbalized understanding of DC instructions. 
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
## 2019-10-07 ENCOUNTER — Other Ambulatory Visit: Payer: Self-pay

## 2019-10-07 ENCOUNTER — Encounter (HOSPITAL_COMMUNITY): Payer: Self-pay

## 2019-10-07 ENCOUNTER — Ambulatory Visit (INDEPENDENT_AMBULATORY_CARE_PROVIDER_SITE_OTHER): Payer: Self-pay

## 2019-10-07 ENCOUNTER — Ambulatory Visit (HOSPITAL_COMMUNITY)
Admission: EM | Admit: 2019-10-07 | Discharge: 2019-10-07 | Disposition: A | Discharge status: Home / Self Care | Payer: Self-pay | Attending: Family Medicine | Admitting: Family Medicine

## 2019-10-07 DIAGNOSIS — S6991XA Unspecified injury of right wrist, hand and finger(s), initial encounter: Secondary | ICD-10-CM

## 2019-10-07 DIAGNOSIS — M79644 Pain in right finger(s): Secondary | ICD-10-CM

## 2019-10-07 MED ORDER — IBUPROFEN 800 MG PO TABS: 800.0000 mg | ORAL_TABLET | Freq: Three times a day (TID) | ORAL | 0 refills | Status: AC

## 2019-10-07 NOTE — Discharge Instructions (Signed)
Take the ibuprofen 3 times a day for the next 3-4 days. Then as needed  Use the ace warp if needed.  If not improving over the next 2 weeks please return for reevaluation  If swelling and pain become worse please return.
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
## 2019-11-23 ENCOUNTER — Emergency Department (HOSPITAL_COMMUNITY): Payer: Self-pay

## 2019-11-23 ENCOUNTER — Emergency Department (HOSPITAL_COMMUNITY)
Admission: EM | Admit: 2019-11-23 | Discharge: 2019-11-23 | Disposition: A | Discharge status: Home / Self Care | Payer: Self-pay | Attending: Emergency Medicine | Admitting: Emergency Medicine

## 2019-11-23 ENCOUNTER — Other Ambulatory Visit: Payer: Self-pay

## 2019-11-23 DIAGNOSIS — Y93F9 Activity, other caregiving: Secondary | ICD-10-CM | POA: Insufficient documentation

## 2019-11-23 DIAGNOSIS — S62316A Displaced fracture of base of fifth metacarpal bone, right hand, initial encounter for closed fracture: Secondary | ICD-10-CM | POA: Insufficient documentation

## 2019-11-23 DIAGNOSIS — S62314A Displaced fracture of base of fourth metacarpal bone, right hand, initial encounter for closed fracture: Secondary | ICD-10-CM | POA: Insufficient documentation

## 2019-11-23 DIAGNOSIS — W208XXA Other cause of strike by thrown, projected or falling object, initial encounter: Secondary | ICD-10-CM | POA: Insufficient documentation

## 2019-11-23 DIAGNOSIS — R110 Nausea: Secondary | ICD-10-CM | POA: Insufficient documentation

## 2019-11-23 DIAGNOSIS — Y92019 Unspecified place in single-family (private) house as the place of occurrence of the external cause: Secondary | ICD-10-CM | POA: Insufficient documentation

## 2019-11-23 DIAGNOSIS — Y990 Civilian activity done for income or pay: Secondary | ICD-10-CM | POA: Insufficient documentation

## 2019-11-23 MED ORDER — OXYCODONE-ACETAMINOPHEN 5-325 MG PO TABS
1.0000 | ORAL_TABLET | Freq: Once | ORAL | Status: AC
  Administered 2019-11-23: 1 via ORAL
  Filled 2019-11-23: qty 1

## 2019-11-23 MED ORDER — OXYCODONE-ACETAMINOPHEN 5-325 MG PO TABS: 1.0000 | ORAL_TABLET | Freq: Four times a day (QID) | ORAL | 0 refills | Status: DC | PRN

## 2019-11-23 MED ORDER — ONDANSETRON 8 MG PO TBDP
8.0000 mg | ORAL_TABLET | Freq: Once | ORAL | Status: AC
  Administered 2019-11-23: 8 mg via ORAL
  Filled 2019-11-23: qty 1
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
## 2019-11-23 NOTE — Discharge Instructions (Addendum)
Keep your splint on clean and dry.  Elevate.  Ice.  Follow-up with Dr. Eulah Pont tomorrow at his office at 8:30 in the morning.  Pain medication as needed.  Return to the emergency department for any worsening or concerning symptoms

## 2019-11-23 NOTE — ED Notes (Signed)
Delay in discharge as pt would now like worker's comp info filed and a urine screen done. Registration made aware.
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
## 2019-11-23 NOTE — Progress Notes (Signed)
Orthopedic Tech Progress Note Patient Details:  Darrell Gonzalez 05/07/1981 [id number]  Ortho Devices Type of Ortho Device: Cotton web roll, Ulna gutter splint, Ace wrap Ortho Device/Splint Location: RLU Ortho Device/Splint Interventions: Ordered, Application   Post Interventions Patient Tolerated: Fair Instructions Provided: Care of device   [employer] 11/23/2019, 3:07 PM

## 2019-11-26 ENCOUNTER — Encounter (HOSPITAL_BASED_OUTPATIENT_CLINIC_OR_DEPARTMENT_OTHER): Payer: Self-pay | Admitting: Orthopedic Surgery

## 2019-11-26 ENCOUNTER — Other Ambulatory Visit (HOSPITAL_COMMUNITY)
Admission: RE | Admit: 2019-11-26 | Discharge: 2019-11-26 | Disposition: A | Discharge status: Home / Self Care | Payer: HRSA Program | Source: Ambulatory Visit | Attending: Orthopedic Surgery | Admitting: Orthopedic Surgery

## 2019-11-26 ENCOUNTER — Other Ambulatory Visit: Payer: Self-pay

## 2019-11-26 DIAGNOSIS — Z01812 Encounter for preprocedural laboratory examination: Secondary | ICD-10-CM | POA: Diagnosis present

## 2019-11-26 DIAGNOSIS — Z20822 Contact with and (suspected) exposure to covid-19: Secondary | ICD-10-CM | POA: Diagnosis not present

## 2019-11-26 LAB — SARS CORONAVIRUS 2 (TAT 6-24 HRS): SARS Coronavirus 2: NEGATIVE

## 2019-11-27 ENCOUNTER — Other Ambulatory Visit: Payer: Self-pay

## 2019-11-27 ENCOUNTER — Encounter (HOSPITAL_BASED_OUTPATIENT_CLINIC_OR_DEPARTMENT_OTHER): Payer: Self-pay | Admitting: Anesthesiology

## 2019-11-27 ENCOUNTER — Encounter (HOSPITAL_BASED_OUTPATIENT_CLINIC_OR_DEPARTMENT_OTHER)
Admission: RE | Disposition: A | Discharge status: Home / Self Care | Payer: Self-pay | Source: Ambulatory Visit | Attending: Orthopedic Surgery

## 2019-11-27 ENCOUNTER — Ambulatory Visit (HOSPITAL_BASED_OUTPATIENT_CLINIC_OR_DEPARTMENT_OTHER)
Admission: RE | Admit: 2019-11-27 | Discharge: 2019-11-27 | Disposition: A | Discharge status: Home / Self Care | Payer: Self-pay | Source: Ambulatory Visit | Attending: Orthopedic Surgery | Admitting: Orthopedic Surgery

## 2019-11-27 ENCOUNTER — Encounter (HOSPITAL_BASED_OUTPATIENT_CLINIC_OR_DEPARTMENT_OTHER): Payer: Self-pay | Admitting: Orthopedic Surgery

## 2019-11-27 DIAGNOSIS — S62316A Displaced fracture of base of fifth metacarpal bone, right hand, initial encounter for closed fracture: Secondary | ICD-10-CM

## 2019-11-27 DIAGNOSIS — S62304A Unspecified fracture of fourth metacarpal bone, right hand, initial encounter for closed fracture: Secondary | ICD-10-CM | POA: Insufficient documentation

## 2019-11-27 DIAGNOSIS — S62141A Displaced fracture of body of hamate [unciform] bone, right wrist, initial encounter for closed fracture: Secondary | ICD-10-CM | POA: Insufficient documentation

## 2019-11-27 DIAGNOSIS — X58XXXA Exposure to other specified factors, initial encounter: Secondary | ICD-10-CM | POA: Insufficient documentation

## 2019-11-27 DIAGNOSIS — S62306A Unspecified fracture of fifth metacarpal bone, right hand, initial encounter for closed fracture: Secondary | ICD-10-CM | POA: Insufficient documentation

## 2019-11-27 DIAGNOSIS — S62314A Displaced fracture of base of fourth metacarpal bone, right hand, initial encounter for closed fracture: Secondary | ICD-10-CM

## 2019-11-27 HISTORY — PX: CLOSED REDUCTION METACARPAL WITH PERCUTANEOUS PINNING: SHX5613

## 2019-11-27 SURGERY — CLOSED REDUCTION, FRACTURE, METACARPAL BONE, WITH PERCUTANEOUS PINNING
Anesthesia: Monitor Anesthesia Care | Site: Finger | Laterality: Right

## 2019-11-27 MED ORDER — ONDANSETRON HCL 4 MG/2ML IJ SOLN
INTRAMUSCULAR | Status: AC
  Filled 2019-11-27: qty 8

## 2019-11-27 MED ORDER — CLONIDINE HCL (ANALGESIA) 100 MCG/ML EP SOLN
EPIDURAL | Status: AC | PRN
  Administered 2019-11-27: 100 ug

## 2019-11-27 MED ORDER — KETOROLAC TROMETHAMINE 30 MG/ML IJ SOLN
INTRAMUSCULAR | Status: AC
  Filled 2019-11-27: qty 1

## 2019-11-27 MED ORDER — ONDANSETRON HCL 4 MG PO TABS: 4.0000 mg | ORAL_TABLET | Freq: Three times a day (TID) | ORAL | 0 refills | Status: AC | PRN

## 2019-11-27 MED ORDER — LIDOCAINE 2% (20 MG/ML) 5 ML SYRINGE
INTRAMUSCULAR | Status: AC
  Filled 2019-11-27: qty 20

## 2019-11-27 MED ORDER — GABAPENTIN 300 MG PO CAPS: 300.0000 mg | ORAL_CAPSULE | Freq: Two times a day (BID) | ORAL | 0 refills | Status: AC

## 2019-11-27 MED ORDER — PROPOFOL 10 MG/ML IV BOLUS
INTRAVENOUS | Status: AC
  Filled 2019-11-27: qty 20

## 2019-11-27 MED ORDER — MIDAZOLAM HCL 2 MG/2ML IJ SOLN
2.0000 mg | Freq: Once | INTRAMUSCULAR | Status: AC
  Administered 2019-11-27: 2 mg via INTRAVENOUS

## 2019-11-27 MED ORDER — PHENYLEPHRINE 40 MCG/ML (10ML) SYRINGE FOR IV PUSH (FOR BLOOD PRESSURE SUPPORT)
PREFILLED_SYRINGE | INTRAVENOUS | Status: AC
  Filled 2019-11-27: qty 10

## 2019-11-27 MED ORDER — ACETAMINOPHEN 500 MG PO TABS: 1000.0000 mg | ORAL_TABLET | Freq: Three times a day (TID) | ORAL | 0 refills | Status: AC

## 2019-11-27 MED ORDER — ROPIVACAINE HCL 7.5 MG/ML IJ SOLN
INTRAMUSCULAR | Status: AC | PRN
  Administered 2019-11-27 (×4): 5 mL via PERINEURAL

## 2019-11-27 MED ORDER — ACETAMINOPHEN 500 MG PO TABS
ORAL_TABLET | ORAL | Status: AC
  Filled 2019-11-27: qty 2

## 2019-11-27 MED ORDER — SUGAMMADEX SODIUM 500 MG/5ML IV SOLN
INTRAVENOUS | Status: AC
  Filled 2019-11-27: qty 5

## 2019-11-27 MED ORDER — OXYCODONE HCL 5 MG PO TABS: 5.0000 mg | ORAL_TABLET | ORAL | 0 refills | Status: AC | PRN

## 2019-11-27 MED ORDER — FENTANYL CITRATE (PF) 100 MCG/2ML IJ SOLN
100.0000 ug | Freq: Once | INTRAMUSCULAR | Status: AC
  Administered 2019-11-27: 100 ug via INTRAVENOUS

## 2019-11-27 MED ORDER — FENTANYL CITRATE (PF) 100 MCG/2ML IJ SOLN
INTRAMUSCULAR | Status: AC
  Filled 2019-11-27: qty 2

## 2019-11-27 MED ORDER — ACETAMINOPHEN 500 MG PO TABS
1000.0000 mg | ORAL_TABLET | Freq: Once | ORAL | Status: AC
  Administered 2019-11-27: 1000 mg via ORAL

## 2019-11-27 MED ORDER — METHOCARBAMOL 500 MG PO TABS: 500.0000 mg | ORAL_TABLET | Freq: Three times a day (TID) | ORAL | 0 refills | Status: DC | PRN

## 2019-11-27 MED ORDER — LACTATED RINGERS IV SOLN: INTRAVENOUS | Status: DC

## 2019-11-27 MED ORDER — EPHEDRINE 5 MG/ML INJ
INTRAVENOUS | Status: AC
  Filled 2019-11-27: qty 10

## 2019-11-27 MED ORDER — ROCURONIUM BROMIDE 10 MG/ML (PF) SYRINGE
PREFILLED_SYRINGE | INTRAVENOUS | Status: AC
  Filled 2019-11-27: qty 20

## 2019-11-27 MED ORDER — MIDAZOLAM HCL 2 MG/2ML IJ SOLN
INTRAMUSCULAR | Status: AC
  Filled 2019-11-27: qty 2

## 2019-11-27 MED ORDER — DEXAMETHASONE SODIUM PHOSPHATE 10 MG/ML IJ SOLN
INTRAMUSCULAR | Status: AC
  Filled 2019-11-27: qty 4

## 2019-11-27 MED ORDER — CEFAZOLIN SODIUM-DEXTROSE 2-4 GM/100ML-% IV SOLN: 2.0000 g | INTRAVENOUS | Status: DC

## 2019-11-27 MED ORDER — CHLORHEXIDINE GLUCONATE 4 % EX LIQD: 60.0000 mL | Freq: Once | CUTANEOUS | Status: DC

## 2019-11-27 MED ORDER — CEFAZOLIN SODIUM-DEXTROSE 2-4 GM/100ML-% IV SOLN
INTRAVENOUS | Status: AC
  Filled 2019-11-27: qty 100

## 2019-11-27 SURGICAL SUPPLY — 61 items
BLADE SURG 15 STRL LF DISP TIS (BLADE) ×1 IMPLANT
BLADE SURG 15 STRL SS (BLADE) ×3
BNDG ELASTIC 4X5.8 VLCR STR LF (GAUZE/BANDAGES/DRESSINGS) ×3 IMPLANT
BNDG ESMARK 4X9 LF (GAUZE/BANDAGES/DRESSINGS) IMPLANT
CHLORAPREP W/TINT 26 (MISCELLANEOUS) ×3 IMPLANT
CLOSURE STERI-STRIP 1/2X4 (GAUZE/BANDAGES/DRESSINGS)
CLSR STERI-STRIP ANTIMIC 1/2X4 (GAUZE/BANDAGES/DRESSINGS) IMPLANT
CORD BIPOLAR FORCEPS 12FT (ELECTRODE) IMPLANT
COVER BACK TABLE 60X90IN (DRAPES) ×3 IMPLANT
COVER WAND RF STERILE (DRAPES) IMPLANT
CUFF TOURN SGL QUICK 18X4 (TOURNIQUET CUFF) ×3 IMPLANT
CUFF TOURN SGL QUICK 24 (TOURNIQUET CUFF)
CUFF TRNQT CYL 24X4X16.5-23 (TOURNIQUET CUFF) IMPLANT
DECANTER SPIKE VIAL GLASS SM (MISCELLANEOUS) IMPLANT
DRAPE EXTREMITY T 121X128X90 (DISPOSABLE) ×3 IMPLANT
DRAPE IMP U-DRAPE 54X76 (DRAPES) ×3 IMPLANT
DRAPE OEC MINIVIEW 54X84 (DRAPES) ×3 IMPLANT
DRAPE SURG 17X23 STRL (DRAPES) IMPLANT
DRSG EMULSION OIL 3X3 NADH (GAUZE/BANDAGES/DRESSINGS) ×3 IMPLANT
GAUZE SPONGE 4X4 12PLY STRL (GAUZE/BANDAGES/DRESSINGS) ×3 IMPLANT
GAUZE XEROFORM 1X8 LF (GAUZE/BANDAGES/DRESSINGS) ×3 IMPLANT
GLOVE BIO SURGEON STRL SZ7.5 (GLOVE) ×6 IMPLANT
GLOVE BIOGEL PI IND STRL 7.0 (GLOVE) ×2 IMPLANT
GLOVE BIOGEL PI IND STRL 8 (GLOVE) ×3 IMPLANT
GLOVE BIOGEL PI INDICATOR 7.0 (GLOVE) ×4
GLOVE BIOGEL PI INDICATOR 8 (GLOVE) ×6
GLOVE ECLIPSE 6.5 STRL STRAW (GLOVE) ×3 IMPLANT
GLOVE ECLIPSE 8.0 STRL XLNG CF (GLOVE) ×3 IMPLANT
GOWN STRL REUS W/ TWL LRG LVL3 (GOWN DISPOSABLE) ×3 IMPLANT
GOWN STRL REUS W/ TWL XL LVL3 (GOWN DISPOSABLE) ×1 IMPLANT
GOWN STRL REUS W/TWL LRG LVL3 (GOWN DISPOSABLE) ×9
GOWN STRL REUS W/TWL XL LVL3 (GOWN DISPOSABLE) ×3
NEEDLE HYPO 25X1 1.5 SAFETY (NEEDLE) ×3 IMPLANT
NS IRRIG 1000ML POUR BTL (IV SOLUTION) ×3 IMPLANT
PAD CAST 4YDX4 CTTN HI CHSV (CAST SUPPLIES) ×1 IMPLANT
PADDING CAST ABS 4INX4YD NS (CAST SUPPLIES) ×2
PADDING CAST ABS COTTON 4X4 ST (CAST SUPPLIES) ×1 IMPLANT
PADDING CAST COTTON 4X4 STRL (CAST SUPPLIES) ×3
SET BASIN DAY SURGERY F.S. (CUSTOM PROCEDURE TRAY) ×3 IMPLANT
SLEEVE SCD COMPRESS KNEE MED (MISCELLANEOUS) IMPLANT
SPLINT FAST PLASTER 5X30 (CAST SUPPLIES)
SPLINT PLASTER CAST FAST 5X30 (CAST SUPPLIES) IMPLANT
SPLINT PLASTER CAST XFAST 3X15 (CAST SUPPLIES) IMPLANT
SPLINT PLASTER CAST XFAST 4X15 (CAST SUPPLIES) ×15 IMPLANT
SPLINT PLASTER XTRA FAST SET 4 (CAST SUPPLIES) ×30
SPLINT PLASTER XTRA FASTSET 3X (CAST SUPPLIES)
SUCTION FRAZIER HANDLE 10FR (MISCELLANEOUS)
SUCTION TUBE FRAZIER 10FR DISP (MISCELLANEOUS) IMPLANT
SUT ETHILON 3 0 PS 1 (SUTURE) IMPLANT
SUT MON AB 2-0 CT1 36 (SUTURE) ×3 IMPLANT
SUT MON AB 4-0 PC3 18 (SUTURE) IMPLANT
SUT PROLENE 3 0 PS 2 (SUTURE) IMPLANT
SUT VIC AB 2-0 SH 27 (SUTURE)
SUT VIC AB 2-0 SH 27XBRD (SUTURE) IMPLANT
SUT VIC AB 3-0 FS2 27 (SUTURE) IMPLANT
SYR BULB EAR ULCER 3OZ GRN STR (SYRINGE) IMPLANT
SYR CONTROL 10ML LL (SYRINGE) ×3 IMPLANT
TOWEL GREEN STERILE FF (TOWEL DISPOSABLE) ×3 IMPLANT
TUBE CONNECTING 20'X1/4 (TUBING)
TUBE CONNECTING 20X1/4 (TUBING) IMPLANT
UNDERPAD 30X36 HEAVY ABSORB (UNDERPADS AND DIAPERS) ×3 IMPLANT

## 2019-11-27 NOTE — Anesthesia Postprocedure Evaluation (Signed)
Anesthesia Post Note  Patient: NOLEN LINDAMOOD  Procedure(s) Performed: CLOSED REDUCTION METACARPAL WITH PERCUTANEOUS PINNING RIGHT 4TH AND 5TH (Right Finger)     Patient location during evaluation: Phase II Anesthesia Type: Regional and MAC Level of consciousness: awake and sedated Pain management: pain level controlled Vital Signs Assessment: post-procedure vital signs reviewed and stable Respiratory status: spontaneous breathing Cardiovascular status: stable Postop Assessment: no apparent nausea or vomiting Anesthetic complications: no    Last Vitals:  Vitals:   11/27/19 1300 11/27/19 1450  BP: 102/78 115/79  Pulse: 71 66  Resp: 14 16  Temp:  36.4 C  SpO2: 100% 100%    Last Pain:  Vitals:   11/27/19 1450  TempSrc:   PainSc: 0-No pain   Pain Goal: Patients Stated Pain Goal: 3 (11/27/19 1145)                 Huston Foley

## 2019-11-27 NOTE — H&P (Signed)
ORTHOPAEDIC CONSULTATION  REQUESTING PHYSICIAN: Renette Butters, MD  Chief Complaint: right 4th and 5th CMC dislocation. Hamate fracture, fractures of 4th and 5th metacarpal  HPI: Darrell Gonzalez is a 39 y.o. male who complains of  Injury to his R hand  History reviewed. No pertinent past medical history. History reviewed. No pertinent surgical history. Social History   Socioeconomic History  . Marital status: Single    Spouse name: Not on file  . Number of children: Not on file  . Years of education: Not on file  . Highest education level: Not on file  Occupational History  . Not on file  Tobacco Use  . Smoking status: Never Smoker  . Smokeless tobacco: Never Used  Substance and Sexual Activity  . Alcohol use: No  . Drug use: No  . Sexual activity: Yes  Other Topics Concern  . Not on file  Social History Narrative  . Not on file   Social Determinants of Health   Financial Resource Strain:   . Difficulty of Paying Living Expenses:   Food Insecurity:   . Worried About Charity fundraiser in the Last Year:   . Arboriculturist in the Last Year:   Transportation Needs:   . Film/video editor (Medical):   Marland Kitchen Lack of Transportation (Non-Medical):   Physical Activity:   . Days of Exercise per Week:   . Minutes of Exercise per Session:   Stress:   . Feeling of Stress :   Social Connections:   . Frequency of Communication with Friends and Family:   . Frequency of Social Gatherings with Friends and Family:   . Attends Religious Services:   . Active Member of Clubs or Organizations:   . Attends Archivist Meetings:   Marland Kitchen Marital Status:    Family History  Family history unknown: Yes   No Known Allergies Prior to Admission medications   Medication Sig Start Date End Date Taking? Authorizing Provider  ibuprofen (ADVIL) 800 MG tablet Take 1 tablet (800 mg total) by mouth 3 (three) times daily. 10/07/19  Yes Darr, Marguerita Beards, PA-C  oxyCODONE-acetaminophen  (PERCOCET/ROXICET) 5-325 MG tablet Take 1-2 tablets by mouth every 6 (six) hours as needed for severe pain. 11/23/19  Yes Hayden Rasmussen, MD  acetaminophen (TYLENOL) 500 MG tablet Take 2 tablets (1,000 mg total) by mouth every 8 (eight) hours for 10 days. For Pain. 11/27/19 12/07/19  Prudencio Burly III, PA-C  gabapentin (NEURONTIN) 300 MG capsule Take 1 capsule (300 mg total) by mouth 2 (two) times daily for 14 days. For 2 weeks post op for pain. 11/27/19 12/11/19  Prudencio Burly III, PA-C  methocarbamol (ROBAXIN) 500 MG tablet Take 1 tablet (500 mg total) by mouth every 8 (eight) hours as needed for muscle spasms. 11/27/19   Prudencio Burly III, PA-C  ondansetron (ZOFRAN) 4 MG tablet Take 1 tablet (4 mg total) by mouth every 8 (eight) hours as needed for nausea or vomiting. 11/27/19   Prudencio Burly III, PA-C   No results found.  Positive ROS: All other systems have been reviewed and were otherwise negative with the exception of those mentioned in the HPI and as above.  Labs cbc No results for input(s): WBC, HGB, HCT, PLT in the last 72 hours.  Labs inflam No results for input(s): CRP in the last 72 hours.  Invalid input(s): ESR  Labs coag No results for input(s): INR, PTT in  the last 72 hours.  Invalid input(s): PT  No results for input(s): NA, K, CL, CO2, GLUCOSE, BUN, CREATININE, CALCIUM in the last 72 hours.  Physical Exam: Vitals:   11/27/19 1255 11/27/19 1300  BP:  102/78  Pulse: 68 71  Resp: 13 14  Temp:    SpO2: 100% 100%   General: Alert, no acute distress Cardiovascular: No pedal edema Respiratory: No cyanosis, no use of accessory musculature GI: No organomegaly, abdomen is soft and non-tender Skin: No lesions in the area of chief complaint other than those listed below in MSK exam.  Neurologic: Sensation intact distally save for the below mentioned MSK exam Psychiatric: Patient is competent for consent with normal mood and  affect Lymphatic: No axillary or cervical lymphadenopathy  MUSCULOSKELETAL:  NVI, compartmants soft Other extremities are atraumatic with painless ROM and NVI.  Assessment: Above mentioned fractures  Plan: Plan for CRPP of 4th and 5th CMC joints CRPP of 4th and 5th MC cractures. Closed manipulation of Hamate fracture.   Renette Butters, MD    11/27/2019 2:05 PM

## 2019-11-27 NOTE — Discharge Instructions (Signed)
Next dose of Tylenol can be given at 6:00pm if needed   Keep hand elevated with ice as much as possible to reduce pain and swelling.  If needed, you may increase pain medication for the first few days post op to 2 tablets every 4 hours.  Stop as needed pain medication as soon as you are able.  Diet: As you were doing prior to hospitalization   Shower:  You have a splint on, leave the splint in place and keep the splint dry with a plastic bag.  Dressing:  You have a splint - leave the splint in place and we will change your bandages during your first follow-up appointment.    Activity:  Increase activity slowly as tolerated, but follow the weight bearing instructions below.  The rules on driving is that you can not be taking narcotics while you drive, and you must feel in control of the vehicle.    Weight Bearing:  Non weight bearing affected hand.  Sling for comfort.  Okay for motion of the shoulder and elbow.  To prevent constipation: you may use a stool softener such as -  Colace (over the counter) 100 mg by mouth twice a day  Drink plenty of fluids (prune juice may be helpful) and high fiber foods Miralax (over the counter) for constipation as needed.    Itching:  If you experience itching with your medications, try taking only a single pain pill, or even half a pain pill at a time.  You can also use benadryl over the counter for itching or also to help with sleep.   Precautions:  If you experience chest pain or shortness of breath - call 911 immediately for transfer to the hospital emergency department!!  If you develop a fever greater that 101 F, purulent drainage from wound, increased redness or drainage from wound, or calf pain -- Call the office at [phone]                                         Follow- Up Appointment:  Please call for an appointment to be seen in 2 weeks [place] - [phone]    Post Anesthesia Home Care Instructions  Activity: Get plenty of  rest for the remainder of the day. A responsible individual must stay with you for 24 hours following the procedure.  For the next 24 hours, DO NOT: -Drive a car -Paediatric nurse -Drink alcoholic beverages -Take any medication unless instructed by your physician -Make any legal decisions or sign important papers.  Meals: Start with liquid foods such as gelatin or soup. Progress to regular foods as tolerated. Avoid greasy, spicy, heavy foods. If nausea and/or vomiting occur, drink only clear liquids until the nausea and/or vomiting subsides. Call your physician if vomiting continues.  Special Instructions/Symptoms: Your throat may feel dry or sore from the anesthesia or the breathing tube placed in your throat during surgery. If this causes discomfort, gargle with warm salt water. The discomfort should disappear within 24 hours.      Regional Anesthesia Blocks  1. Numbness or the inability to move the "blocked" extremity may last from 3-48 hours after placement. The length of time depends on the medication injected and your individual response to the medication. If the numbness is not going away after 48 hours, call your surgeon.  2. The extremity that is blocked will need to be  protected until the numbness is gone and the  Strength has returned. Because you cannot feel it, you will need to take extra care to avoid injury. Because it may be weak, you may have difficulty moving it or using it. You may not know what position it is in without looking at it while the block is in effect.  3. For blocks in the legs and feet, returning to weight bearing and walking needs to be done carefully. You will need to wait until the numbness is entirely gone and the strength has returned. You should be able to move your leg and foot normally before you try and bear weight or walk. You will need someone to be with you when you first try to ensure you do not fall and possibly risk injury.  4. Bruising and  tenderness at the needle site are common side effects and will resolve in a few days.  5. Persistent numbness or new problems with movement should be communicated to the surgeon or the [facility] [phone] [facility] [phone]).

## 2019-11-27 NOTE — Progress Notes (Signed)
AssistedDr. Hatchett with right, ultrasound guided, supraclavicular block. Side rails up, monitors on throughout procedure. See vital signs in flow sheet. Tolerated Procedure well.  

## 2019-11-27 NOTE — Progress Notes (Signed)
Received report from Bridget Hartshorn, RN in PACU for phase 2

## 2019-11-27 NOTE — Anesthesia Preprocedure Evaluation (Signed)
Anesthesia Evaluation  Patient identified by MRN, date of birth, ID band Patient awake    Reviewed: Allergy & Precautions, NPO status , Patient's Chart, lab work & pertinent test results  Airway Mallampati: I       Dental no notable dental hx. (+) Teeth Intact   Pulmonary neg pulmonary ROS,    Pulmonary exam normal breath sounds clear to auscultation       Cardiovascular negative cardio ROS Normal cardiovascular exam Rhythm:Regular Rate:Normal     Neuro/Psych negative psych ROS   GI/Hepatic negative GI ROS, Neg liver ROS,   Endo/Other  negative endocrine ROS  Renal/GU negative Renal ROS  negative genitourinary   Musculoskeletal   Abdominal Normal abdominal exam  (+)   Peds  Hematology negative hematology ROS (+)   Anesthesia Other Findings   Reproductive/Obstetrics                             Anesthesia Physical Anesthesia Plan  ASA: I  Anesthesia Plan: MAC and Regional   Post-op Pain Management:  Regional for Post-op pain   Induction:   PONV Risk Score and Plan: 1  Airway Management Planned: Nasal Cannula, Natural Airway and Simple Face Mask  Additional Equipment: None  Intra-op Plan:   Post-operative Plan:   Informed Consent: I have reviewed the patients History and Physical, chart, labs and discussed the procedure including the risks, benefits and alternatives for the proposed anesthesia with the patient or authorized representative who has indicated his/her understanding and acceptance.       Plan Discussed with: CRNA  Anesthesia Plan Comments:         Anesthesia Quick Evaluation

## 2019-11-27 NOTE — Progress Notes (Signed)
Discharge instructions went over with patient. Patient's friend, Minerva Areola, came to pick patient up. RN attempted to review discharge instructions with patient's friend at car, however, patient refused. Patient refused for RN to review discharge instructions with anyone.

## 2019-11-27 NOTE — Op Note (Signed)
11/27/2019  2:07 PM  PATIENT:  Darrell Gonzalez    PRE-OPERATIVE DIAGNOSIS:  RIGHT 4TH AND 5TH METARCARPAL FRACTURES  POST-OPERATIVE DIAGNOSIS:  Same  PROCEDURE:  CLOSED REDUCTION METACARPAL WITH PERCUTANEOUS PINNING RIGHT 4TH AND 5TH  SURGEON:  Sheral Apley, MD  ASSISTANT: Aquilla Hacker, PA-C, he was present and scrubbed throughout the case, critical for completion in a timely fashion, and for retraction, instrumentation, and closure.   ANESTHESIA:   gen  PREOPERATIVE INDICATIONS:  Darrell Gonzalez is a  39 y.o. male with a diagnosis of RIGHT 4TH AND 5TH METARCARPAL FRACTURES who failed conservative measures and elected for surgical management.    The risks benefits and alternatives were discussed with the patient preoperatively including but not limited to the risks of infection, bleeding, nerve injury, cardiopulmonary complications, the need for revision surgery, among others, and the patient was willing to proceed.  OPERATIVE IMPLANTS: K wires  OPERATIVE FINDINGS: unstable CMC joints  BLOOD LOSS: min  COMPLICATIONS: none  TOURNIQUET TIME: none  OPERATIVE PROCEDURE:  Patient was identified in the preoperative holding area and site was marked by me He was transported to the operating theater and placed on the table in supine position taking care to pad all bony prominences. After a preincinduction time out anesthesia was induced. The right upper extremity was prepped and draped in normal sterile fashion and a pre-incision timeout was performed. He received ancef for preoperative antibiotics.   I performed a manual manipulation and reduction of his fourth and fifth CMC joints.  Next I placed K wire fixation across his fifth [facility] joint securing this in place.  Next I placed a K wire across the fourth [facility] dislocation securing not in the place.  This closed manipulation effectively reduced the hamate avulsion fractures.  I then placed K wires crossing the fourth fifth and third  metacarpal bases to secure the metacarpal fractures of fourth and fifth as well.  I reviewed 4+ x-rays of the hand and was happy with the alignment of the joint and pin placement sterile dressing was then applied in an ulnar gutter splint.  POST OPERATIVE PLAN: mobilize for dvt px

## 2019-11-27 NOTE — Anesthesia Procedure Notes (Signed)
Anesthesia Regional Block: Supraclavicular block   Pre-Anesthetic Checklist: ,, timeout performed, Correct Patient, Correct Site, Correct Laterality, Correct Procedure, Correct Position, site marked, Risks and benefits discussed,  Surgical consent,  Pre-op evaluation,  At surgeon's request and post-op pain management  Laterality: Upper and Right  Prep: chloraprep       Needles:  Injection technique: Single-shot  Needle Type: Echogenic Stimulator Needle     Needle Length: 9cm  Needle Gauge: 20   Needle insertion depth: 2 cm   Additional Needles:   Procedures:,,,, ultrasound used (permanent image in chart),,,,  Narrative:  Start time: 11/27/2019 11:10 AM End time: 11/27/2019 11:20 AM Injection made incrementally with aspirations every 5 mL.  Performed by: Personally  Anesthesiologist: Leilani Able, MD

## 2020-02-14 ENCOUNTER — Emergency Department (HOSPITAL_COMMUNITY)
Admission: EM | Admit: 2020-02-14 | Discharge: 2020-02-14 | Disposition: A | Discharge status: Home / Self Care | Payer: 59 | Attending: Emergency Medicine | Admitting: Emergency Medicine

## 2020-02-14 ENCOUNTER — Other Ambulatory Visit: Payer: Self-pay

## 2020-02-14 ENCOUNTER — Encounter (HOSPITAL_COMMUNITY): Payer: Self-pay | Admitting: Emergency Medicine

## 2020-02-14 DIAGNOSIS — R591 Generalized enlarged lymph nodes: Secondary | ICD-10-CM | POA: Diagnosis not present

## 2020-02-14 DIAGNOSIS — Z20822 Contact with and (suspected) exposure to covid-19: Secondary | ICD-10-CM | POA: Insufficient documentation

## 2020-02-14 LAB — URINALYSIS, ROUTINE W REFLEX MICROSCOPIC
Bilirubin Urine: NEGATIVE
Glucose, UA: NEGATIVE mg/dL
Hgb urine dipstick: NEGATIVE
Ketones, ur: NEGATIVE mg/dL
Leukocytes,Ua: NEGATIVE
Nitrite: NEGATIVE
Protein, ur: NEGATIVE mg/dL
Specific Gravity, Urine: 1.009 (ref 1.005–1.030)
pH: 6 (ref 5.0–8.0)

## 2020-02-14 LAB — SARS CORONAVIRUS 2 BY RT PCR (HOSPITAL ORDER, PERFORMED IN ~~LOC~~ HOSPITAL LAB): SARS Coronavirus 2: NEGATIVE

## 2020-02-14 NOTE — ED Notes (Signed)
RN attempted to start IV and pt was questioning orders from PA. PA at bedside now to explain why pt needs CT scan, IV and labs.

## 2020-02-14 NOTE — ED Provider Notes (Signed)
[facility]-EMERGENCY DEPT Provider Note   CSN: [id number] Arrival date & time: 02/14/20  1329     History Chief Complaint  Patient presents with  . Groin Pain  . Groin Swelling    Darrell Gonzalez is a 39 y.o. male who presents emergency department chief complaint of right groin pain.  Patient states that he started noticing some pain and swelling in this region about 4 to 5 days ago.  He also started noticing some pain in the right medial thigh around the same time.  He denies any urinary symptoms, skin infections, penile discharge, testicle pain.  He states "I think I might also have Covid."  Because he has a mildly sore throat and has noticed some swelling in the lymph nodes.  Last night he felt some pain with deep breathing and some shortness of breath. HPI     History reviewed. No pertinent past medical history.  There are no problems to display for this patient.   Past Surgical History:  Procedure Laterality Date  . CLOSED REDUCTION METACARPAL WITH PERCUTANEOUS PINNING Right 11/27/2019   Procedure: CLOSED REDUCTION METACARPAL WITH PERCUTANEOUS PINNING RIGHT 4TH AND 5TH;  Surgeon: Sheral Apley, MD;  Location: [facility];  Service: Orthopedics;  Laterality: Right;  block in preop       Family History  Family history unknown: Yes    Social History   Tobacco Use  . Smoking status: Never Smoker  . Smokeless tobacco: Never Used  Substance Use Topics  . Alcohol use: No  . Drug use: No    Home Medications Prior to Admission medications   Medication Sig Start Date End Date Taking? Authorizing Provider  gabapentin (NEURONTIN) 300 MG capsule Take 1 capsule (300 mg total) by mouth 2 (two) times daily for 14 days. For 2 weeks post op for pain. 11/27/19 12/11/19  Albina Billet III, PA-C  ibuprofen (ADVIL) 800 MG tablet Take 1 tablet (800 mg total) by mouth 3 (three) times daily. 10/07/19   Darr, Veryl Speak, PA-C  methocarbamol  (ROBAXIN) 500 MG tablet Take 1 tablet (500 mg total) by mouth every 8 (eight) hours as needed for muscle spasms. 11/27/19   Albina Billet III, PA-C  ondansetron (ZOFRAN) 4 MG tablet Take 1 tablet (4 mg total) by mouth every 8 (eight) hours as needed for nausea or vomiting. 11/27/19   Albina Billet III, PA-C    Allergies    Patient has no known allergies.  Review of Systems   Review of Systems Ten systems reviewed and are negative for acute change, except as noted in the HPI.   Physical Exam Updated Vital Signs BP 129/90 (BP Location: Left Arm)   Pulse 70   Temp 98.3 F (36.8 C) (Oral)   Resp 18   Ht 5\' 6"  (1.676 m)   Wt 74.3 kg   SpO2 96%   BMI 26.42 kg/m   Physical Exam Vitals and nursing note reviewed.  Constitutional:      General: He is not in acute distress.    Appearance: He is well-developed. He is not diaphoretic.  HENT:     Head: Normocephalic and atraumatic.  Eyes:     General: No scleral icterus.    Conjunctiva/sclera: Conjunctivae normal.  Cardiovascular:     Rate and Rhythm: Normal rate and regular rhythm.     Heart sounds: Normal heart sounds.  Pulmonary:     Effort: Pulmonary effort is normal. No respiratory distress.  Breath sounds: Normal breath sounds.  Abdominal:     Palpations: Abdomen is soft.     Tenderness: There is no abdominal tenderness.     Hernia: There is no hernia in the left inguinal area or right inguinal area.  Genitourinary:    Pubic Area: No rash.      Penis: Circumcised.      Testes: Normal.        Right: Mass, tenderness or swelling not present.        Left: Mass, tenderness or swelling not present.  Musculoskeletal:     Cervical back: Normal range of motion and neck supple.  Lymphadenopathy:     Head:     Right side of head: Tonsillar adenopathy present.     Left side of head: Tonsillar adenopathy present.     Cervical: Cervical adenopathy present.     Right cervical: Superficial cervical adenopathy,  deep cervical adenopathy and posterior cervical adenopathy present.     Left cervical: Superficial cervical adenopathy, deep cervical adenopathy and posterior cervical adenopathy present.     Upper Body:     Right upper body: Supraclavicular adenopathy and axillary adenopathy present.     Left upper body: Supraclavicular adenopathy and axillary adenopathy present.     Lower Body: Right inguinal adenopathy present. Left inguinal adenopathy present.  Skin:    General: Skin is warm and dry.  Neurological:     Mental Status: He is alert.  Psychiatric:        Behavior: Behavior normal.     ED Results / Procedures / Treatments   Labs (all labs ordered are listed, but only abnormal results are displayed) Labs Reviewed  URINE CULTURE  URINALYSIS, ROUTINE W REFLEX MICROSCOPIC    EKG None  Radiology No results found.  Procedures Procedures (including critical care time)  Medications Ordered in ED Medications - No data to display  ED Course  I have reviewed the triage vital signs and the nursing notes.  Pertinent labs & imaging results that were available during my care of the patient were reviewed by me and considered in my medical decision making (see chart for details).    MDM Rules/Calculators/A&P                         Patient here with complaint of groin pain.  He has diffuse lymphadenopathy in the groin, bilateral axilla, supraclavicular and cervical region.  Discussed with the patient that there is a large differential diagnosis which includes lymphoma, lymphoproliferative disorder, infections.  Patient's mother was on the phone with him and discouraged him from getting contrasted study.  I did discuss with the patient that it is his choice as he is the patient but a noncontrast study would not be helpful.  Had a long discussion with the patient as to why I placed these orders and what I am looking for.  Patient ultimately declined further work-up and got a Covid test.  He  may follow-up in the outpatient setting as he chooses. Final Clinical Impression(s) / ED Diagnoses Final diagnoses:  None    Rx / DC Orders ED Discharge Orders    None       Arthor Captain, PA-C 02/15/20 0008    Mancel Bale, MD 02/15/20 2337

## 2020-02-14 NOTE — Discharge Instructions (Addendum)
Contact a health care provider if you have:  Swelling that gets worse or spreads to other areas.  Problems with breathing.  Lymph glands that:  Are still swollen after 2 weeks.  Have suddenly gotten bigger.  Are red, painful, or hard.  A fever or chills.  Fatigue.  A sore throat.  Pain in your abdomen.  Weight loss.  Night sweats.  Get help right away if you have:  Fluid leaking from an enlarged lymph gland.  Severe pain.  Chest pain.  Shortness of breath.

## 2020-02-14 NOTE — ED Triage Notes (Signed)
Patient here from home reporting right side groin pain radiating down into leg and up around to flank that started 4-5 days ago.

## 2020-02-15 LAB — URINE CULTURE: Culture: NO GROWTH

## 2020-03-06 ENCOUNTER — Ambulatory Visit (HOSPITAL_COMMUNITY)
Admission: EM | Admit: 2020-03-06 | Discharge: 2020-03-06 | Disposition: A | Discharge status: Home / Self Care | Payer: 59 | Attending: Internal Medicine | Admitting: Internal Medicine

## 2020-03-06 ENCOUNTER — Ambulatory Visit (INDEPENDENT_AMBULATORY_CARE_PROVIDER_SITE_OTHER): Payer: 59

## 2020-03-06 ENCOUNTER — Encounter (HOSPITAL_COMMUNITY): Payer: Self-pay

## 2020-03-06 ENCOUNTER — Other Ambulatory Visit: Payer: Self-pay

## 2020-03-06 DIAGNOSIS — R509 Fever, unspecified: Secondary | ICD-10-CM | POA: Diagnosis not present

## 2020-03-06 DIAGNOSIS — Z791 Long term (current) use of non-steroidal anti-inflammatories (NSAID): Secondary | ICD-10-CM | POA: Insufficient documentation

## 2020-03-06 DIAGNOSIS — Z20822 Contact with and (suspected) exposure to covid-19: Secondary | ICD-10-CM | POA: Diagnosis not present

## 2020-03-06 DIAGNOSIS — R05 Cough: Secondary | ICD-10-CM

## 2020-03-06 DIAGNOSIS — J189 Pneumonia, unspecified organism: Secondary | ICD-10-CM | POA: Diagnosis not present

## 2020-03-06 DIAGNOSIS — Z79899 Other long term (current) drug therapy: Secondary | ICD-10-CM | POA: Diagnosis not present

## 2020-03-06 MED ORDER — AZITHROMYCIN 250 MG PO TABS
ORAL_TABLET | ORAL | 0 refills | Status: AC
Start: 2020-03-06 — End: 2020-03-11

## 2020-03-06 MED ORDER — ACETAMINOPHEN 325 MG PO TABS
ORAL_TABLET | ORAL | Status: AC
  Filled 2020-03-06: qty 2

## 2020-03-06 MED ORDER — AMOXICILLIN-POT CLAVULANATE 875-125 MG PO TABS: 1.0000 | ORAL_TABLET | Freq: Two times a day (BID) | ORAL | 0 refills | Status: AC

## 2020-03-06 MED ORDER — ACETAMINOPHEN 325 MG PO TABS
650.0000 mg | ORAL_TABLET | Freq: Once | ORAL | Status: AC
  Administered 2020-03-06: 650 mg via ORAL

## 2020-03-06 NOTE — ED Provider Notes (Signed)
[facility]    CSN: [id number] Arrival date & time: 03/06/20  1256      History   Chief Complaint Chief Complaint  Patient presents with  . Chills  . Fatigue  . Cough  . Weakness    HPI Darrell Gonzalez is a 39 y.o. male.   Darrell Gonzalez presents with complaints of fever and chills since 8/30. Cough started yesterday and has been worsening. Decreased appetite. No nasal drainage. Headache. No sore throat. No shortness of breath . Taking ibuprofen and tylenol, which helped some. Hasn't taken anything today. Has been taking zinc and vitamin c. No known ill contacts. Denies any previous similar. No history of covid-19 and has not received vaccination. No gi symptoms.   ROS per HPI, negative if not otherwise mentioned.      History reviewed. No pertinent past medical history.  There are no problems to display for this patient.   Past Surgical History:  Procedure Laterality Date  . CLOSED REDUCTION METACARPAL WITH PERCUTANEOUS PINNING Right 11/27/2019   Procedure: CLOSED REDUCTION METACARPAL WITH PERCUTANEOUS PINNING RIGHT 4TH AND 5TH;  Surgeon: Sheral Apley, MD;  Location: [facility];  Service: Orthopedics;  Laterality: Right;  block in preop       Home Medications    Prior to Admission medications   Medication Sig Start Date End Date Taking? Authorizing Provider  amoxicillin-clavulanate (AUGMENTIN) 875-125 MG tablet Take 1 tablet by mouth every 12 (twelve) hours for 10 days. 03/06/20 03/16/20  Georgetta Haber, NP  azithromycin (ZITHROMAX) 250 MG tablet Take 2 tablets (500 mg total) by mouth daily for 1 day, THEN 1 tablet (250 mg total) daily for 4 days. 03/06/20 03/11/20  Georgetta Haber, NP  gabapentin (NEURONTIN) 300 MG capsule Take 1 capsule (300 mg total) by mouth 2 (two) times daily for 14 days. For 2 weeks post op for pain. 11/27/19 12/11/19  Albina Billet III, PA-C  ibuprofen (ADVIL) 800 MG tablet Take 1 tablet (800 mg total) by mouth  3 (three) times daily. 10/07/19   Darr, Veryl Speak, PA-C  methocarbamol (ROBAXIN) 500 MG tablet Take 1 tablet (500 mg total) by mouth every 8 (eight) hours as needed for muscle spasms. 11/27/19   Albina Billet III, PA-C  ondansetron (ZOFRAN) 4 MG tablet Take 1 tablet (4 mg total) by mouth every 8 (eight) hours as needed for nausea or vomiting. 11/27/19   Albina Billet III, PA-C    Family History Family History  Family history unknown: Yes    Social History Social History   Tobacco Use  . Smoking status: Never Smoker  . Smokeless tobacco: Never Used  Substance Use Topics  . Alcohol use: No  . Drug use: No     Allergies   Patient has no known allergies.   Review of Systems Review of Systems   Physical Exam Triage Vital Signs ED Triage Vitals  Enc Vitals Group     BP 03/06/20 1527 117/74     Pulse Rate 03/06/20 1527 (!) 112     Resp 03/06/20 1527 20     Temp 03/06/20 1527 (!) 103.1 F (39.5 C)     Temp Source 03/06/20 1527 Oral     SpO2 03/06/20 1527 98 %     Weight --      Height --      Head Circumference --      Peak Flow --      Pain Score  03/06/20 1525 6     Pain Loc --      Pain Edu? --      Excl. in GC? --    No data found.  Updated Vital Signs BP 104/70 (BP Location: Right Arm)   Pulse (!) 102   Temp 99.6 F (37.6 C) (Oral)   Resp 20   SpO2 96%   Visual Acuity Right Eye Distance:   Left Eye Distance:   Bilateral Distance:    Right Eye Near:   Left Eye Near:    Bilateral Near:     Physical Exam Constitutional:      Appearance: He is well-developed. He is ill-appearing.  Cardiovascular:     Rate and Rhythm: Tachycardia present.  Pulmonary:     Effort: Pulmonary effort is normal.     Breath sounds: Examination of the left-middle field reveals decreased breath sounds. Examination of the left-lower field reveals decreased breath sounds. Decreased breath sounds present.  Skin:    General: Skin is warm and dry.  Neurological:       Mental Status: He is alert and oriented to person, place, and time.      UC Treatments / Results  Labs (all labs ordered are listed, but only abnormal results are displayed) Labs Reviewed  SARS CORONAVIRUS 2 (TAT 6-24 HRS)    EKG   Radiology DG Chest 2 View  Result Date: 03/06/2020 CLINICAL DATA:  Fever, cough. EXAM: CHEST - 2 VIEW COMPARISON:  None. FINDINGS: The heart size and mediastinal contours are within normal limits. Right lung is clear. Left basilar opacity is noted concerning for pneumonia. The visualized skeletal structures are unremarkable. IMPRESSION: Left basilar pneumonia. Electronically Signed   By: Darrell Gonzalez M.D.   On: 03/06/2020 15:54    Procedures Procedures (including critical care time)  Medications Ordered in UC Medications  acetaminophen (TYLENOL) tablet 650 mg (650 mg Oral Given 03/06/20 1532)    Initial Impression / Assessment and Plan / UC Course  I have reviewed the triage vital signs and the nursing notes.  Pertinent labs & imaging results that were available during my care of the patient were reviewed by me and considered in my medical decision making (see chart for details).     Left basilar pneumonia on chest xray, consistent with cough, fevers, tachycardia. covid pending although xray is not necessarily consistent with covid at this time. ivf offered as tachycardic and patient poor feeling, he declines. Isolation encouraged. Return precautions provided. Patient verbalized understanding and agreeable to plan.   Final Clinical Impressions(s) / UC Diagnoses   Final diagnoses:  Community acquired pneumonia of left lower lobe of lung     Discharge Instructions     Your xray today is consistent with pneumonia so I have sent antibiotics to treat this.  Push fluids to ensure adequate hydration and keep secretions thin.  Tylenol and/or ibuprofen as needed for pain or fevers.  Rest.  Diet as tolerated.  Self isolate until covid results  are back and negative.  Will notify you by phone of any positive findings. Your negative results will be sent through your MyChart.     Your xray is not consistent with covid at this time however.  If worsening of symptoms please go to the ER.  Follow up with a PCP for recheck of your symptoms in the next 2 months.     ED Prescriptions    Medication Sig Dispense Auth. Provider   azithromycin (ZITHROMAX) 250 MG tablet  Take 2 tablets (500 mg total) by mouth daily for 1 day, THEN 1 tablet (250 mg total) daily for 4 days. 6 tablet Linus Mako B, NP   amoxicillin-clavulanate (AUGMENTIN) 875-125 MG tablet Take 1 tablet by mouth every 12 (twelve) hours for 10 days. 20 tablet Georgetta Haber, NP     PDMP not reviewed this encounter.   Georgetta Haber, NP 03/06/20 [phone]

## 2020-03-06 NOTE — ED Notes (Signed)
Patient discharged at 48 15.  Patient's recheck of vital signs were reported to natalie, np.  No further orders

## 2020-03-06 NOTE — ED Triage Notes (Signed)
Pt presents with non productive cough, chills, fatigue, and weakness X 1 week.

## 2020-03-06 NOTE — Discharge Instructions (Signed)
Your xray today is consistent with pneumonia so I have sent antibiotics to treat this.  Push fluids to ensure adequate hydration and keep secretions thin.  Tylenol and/or ibuprofen as needed for pain or fevers.  Rest.  Diet as tolerated.  Self isolate until covid results are back and negative.  Will notify you by phone of any positive findings. Your negative results will be sent through your MyChart.     Your xray is not consistent with covid at this time however.  If worsening of symptoms please go to the ER.  Follow up with a PCP for recheck of your symptoms in the next 2 months.

## 2020-03-07 LAB — SARS CORONAVIRUS 2 (TAT 6-24 HRS): SARS Coronavirus 2: POSITIVE — AB

## 2020-03-08 ENCOUNTER — Telehealth (HOSPITAL_COMMUNITY): Payer: Self-pay | Admitting: Adult Health

## 2020-03-08 ENCOUNTER — Ambulatory Visit (HOSPITAL_COMMUNITY)
Admission: RE | Admit: 2020-03-08 | Discharge: 2020-03-08 | Disposition: A | Discharge status: Home / Self Care | Payer: 59 | Source: Ambulatory Visit | Attending: Pulmonary Disease | Admitting: Pulmonary Disease

## 2020-03-08 ENCOUNTER — Other Ambulatory Visit (HOSPITAL_COMMUNITY): Payer: Self-pay | Admitting: Adult Health

## 2020-03-08 DIAGNOSIS — U071 COVID-19: Secondary | ICD-10-CM

## 2020-03-08 MED ORDER — ALBUTEROL SULFATE HFA 108 (90 BASE) MCG/ACT IN AERS: 2.0000 | INHALATION_SPRAY | Freq: Once | RESPIRATORY_TRACT | Status: DC | PRN

## 2020-03-08 MED ORDER — SODIUM CHLORIDE 0.9 % IV SOLN: INTRAVENOUS | Status: DC | PRN

## 2020-03-08 MED ORDER — BENZONATATE 100 MG PO CAPS: 200.0000 mg | ORAL_CAPSULE | Freq: Three times a day (TID) | ORAL | 0 refills | Status: AC | PRN

## 2020-03-08 MED ORDER — FAMOTIDINE IN NACL 20-0.9 MG/50ML-% IV SOLN: 20.0000 mg | Freq: Once | INTRAVENOUS | Status: DC | PRN

## 2020-03-08 MED ORDER — ONDANSETRON HCL 4 MG/2ML IJ SOLN
8.0000 mg | Freq: Once | INTRAMUSCULAR | Status: DC
  Filled 2020-03-08: qty 4

## 2020-03-08 MED ORDER — EPINEPHRINE 0.3 MG/0.3ML IJ SOAJ: 0.3000 mg | Freq: Once | INTRAMUSCULAR | Status: DC | PRN

## 2020-03-08 MED ORDER — SODIUM CHLORIDE 0.9 % IV SOLN: Freq: Once | INTRAVENOUS | Status: AC

## 2020-03-08 MED ORDER — DIPHENHYDRAMINE HCL 50 MG/ML IJ SOLN: 50.0000 mg | Freq: Once | INTRAMUSCULAR | Status: DC | PRN

## 2020-03-08 MED ORDER — SODIUM CHLORIDE 0.9 % IV SOLN
1200.0000 mg | Freq: Once | INTRAVENOUS | Status: AC
  Administered 2020-03-08: 1200 mg via INTRAVENOUS
  Filled 2020-03-08: qty 10

## 2020-03-08 MED ORDER — METHYLPREDNISOLONE SODIUM SUCC 125 MG IJ SOLR: 125.0000 mg | Freq: Once | INTRAMUSCULAR | Status: DC | PRN

## 2020-03-08 NOTE — Progress Notes (Signed)
I connected by phone with Darrell Gonzalez on 03/08/2020 at 9:58 AM to discuss the potential use of a new treatment for mild to moderate COVID-19 viral infection in non-hospitalized patients.  This patient is a 39 y.o. male that meets the FDA criteria for Emergency Use Authorization of COVID monoclonal antibody casirivimab/imdevimab.  Has a (+) direct SARS-CoV-2 viral test result  Has mild or moderate COVID-19   Is NOT hospitalized due to COVID-19  Is within 10 days of symptom onset  Has at least one of the high risk factor(s) for progression to severe COVID-19 and/or hospitalization as defined in EUA.  Specific high risk criteria : Other high risk medical condition per CDC:  non vaccinated, increased social risk factor   I have spoken and communicated the following to the patient or parent/caregiver regarding COVID monoclonal antibody treatment:  1. FDA has authorized the emergency use for the treatment of mild to moderate COVID-19 in adults and pediatric patients with positive results of direct SARS-CoV-2 viral testing who are 6 years of age and older weighing at least 40 kg, and who are at high risk for progressing to severe COVID-19 and/or hospitalization.  2. The significant known and potential risks and benefits of COVID monoclonal antibody, and the extent to which such potential risks and benefits are unknown.  3. Information on available alternative treatments and the risks and benefits of those alternatives, including clinical trials.  4. Patients treated with COVID monoclonal antibody should continue to self-isolate and use infection control measures (e.g., wear mask, isolate, social distance, avoid sharing personal items, clean and disinfect "high touch" surfaces, and frequent handwashing) according to CDC guidelines.   5. The patient or parent/caregiver has the option to accept or refuse COVID monoclonal antibody treatment.  After reviewing this information with the patient, The  patient agreed to proceed with receiving casirivimab\imdevimab infusion and will be provided a copy of the Fact sheet prior to receiving the infusion. [name] 03/08/2020 9:58 AM

## 2020-03-08 NOTE — Progress Notes (Signed)
Darrell Gonzalez is a 39 year old mam who is here today for treatment with monoclonal antibody therapy for his covid 19 infection.  I spoke with him earlier and he had noted he was recommended to take hot showers and was dizzy.  He has had some difficulty with nausea, along with fever, decreased oral intake and generalized weakness.  He denies any bowel/bladder changes.  He denies any falls.  He has no shortness of breath, chest pain or palpitations.    His med list includes Augmentin BID Azithromycin 500mg  on day 1 then 250mg  daily until complete Vitamin D 3 1 tab daily Zinc 50mg  1 tab daily Vitamin C 1000mg  daily Tylenol 500 gm, 2 tab q6hprn Motrin 200mg , 00mg  q8hrprn  O. BP 100/66 (BP Location: Right Arm)   Pulse (!) 101   Temp (!) 100.9 F (38.3 C) (Oral)   Resp 20   SpO2 98%  Patient is a tired appearing male, in no acute distress Oral: MMM Cardiac, RRR, no MRG Lungs: CTA, no tachypnea or accessory muscle use  A. COVID19 infection  (a) mild dehydration  (b) nausea  P.  Patient is dehydrated and will receive IV fluids today with one liter of normal saline.  I had ordered IV Zofran, however we will hold off on giving that sense he is feeling improved from his nausea.  He will go ahead and receive the Regeneron.  We reviewed his at home medications and I recommended that he eat food prior to taking the Augmentin.  I also recommended that he take Vitamin C in the morning and Zinc and Vitamin D at night.  He will continue on his at home medication regimen.  I did attempt to call his mom and update her on his status, and LMOM.  He was recommended to stop taking hot showers as this was leading to his dizziness.    , NP

## 2020-03-08 NOTE — Progress Notes (Signed)
At completion of therapy patient requested cough medication sent into his pharmacy.  I sent in tessalon Perrles TID.    [name], NP

## 2020-03-08 NOTE — Telephone Encounter (Signed)
Called patient mom to review Darrell Gonzalez and how he appears.  She did not answer, LMOM.  [name]

## 2020-03-08 NOTE — Progress Notes (Signed)
  Diagnosis: COVID-19  Physician: Dr. Wright  Procedure: [facility] Infusion [facility] Med: casirivimab\imdevimab infusion - Provided patient with casirivimab\imdevimab fact sheet for patients, parents and caregivers prior to infusion.  Complications: No immediate complications noted.  Discharge: Discharged home   Elodie Panameno E Kenyia Wambolt 03/08/2020   

## 2020-03-08 NOTE — Progress Notes (Addendum)
Nurse tech informed this RN that this Pt was laying back in chair stating he was coughing more than normal. NT sat pt chair up into sitting position and informed this RN. When this RN when to check on pt he stated he feels better sitting up but is still having coughing. Sp02@ 96% RA. Mardella Layman, NP notified

## 2020-03-08 NOTE — Discharge Instructions (Signed)

## 2021-10-20 IMAGING — DX DG HAND COMPLETE 3+V*R*
3 series · 3 of 3 positions shown · non-contrast
Comparison: None.

CLINICAL DATA: Injury, pain in 3rd through 5th metacarpals

EXAM:
RIGHT HAND - COMPLETE 3+ VIEW

[hand pa]
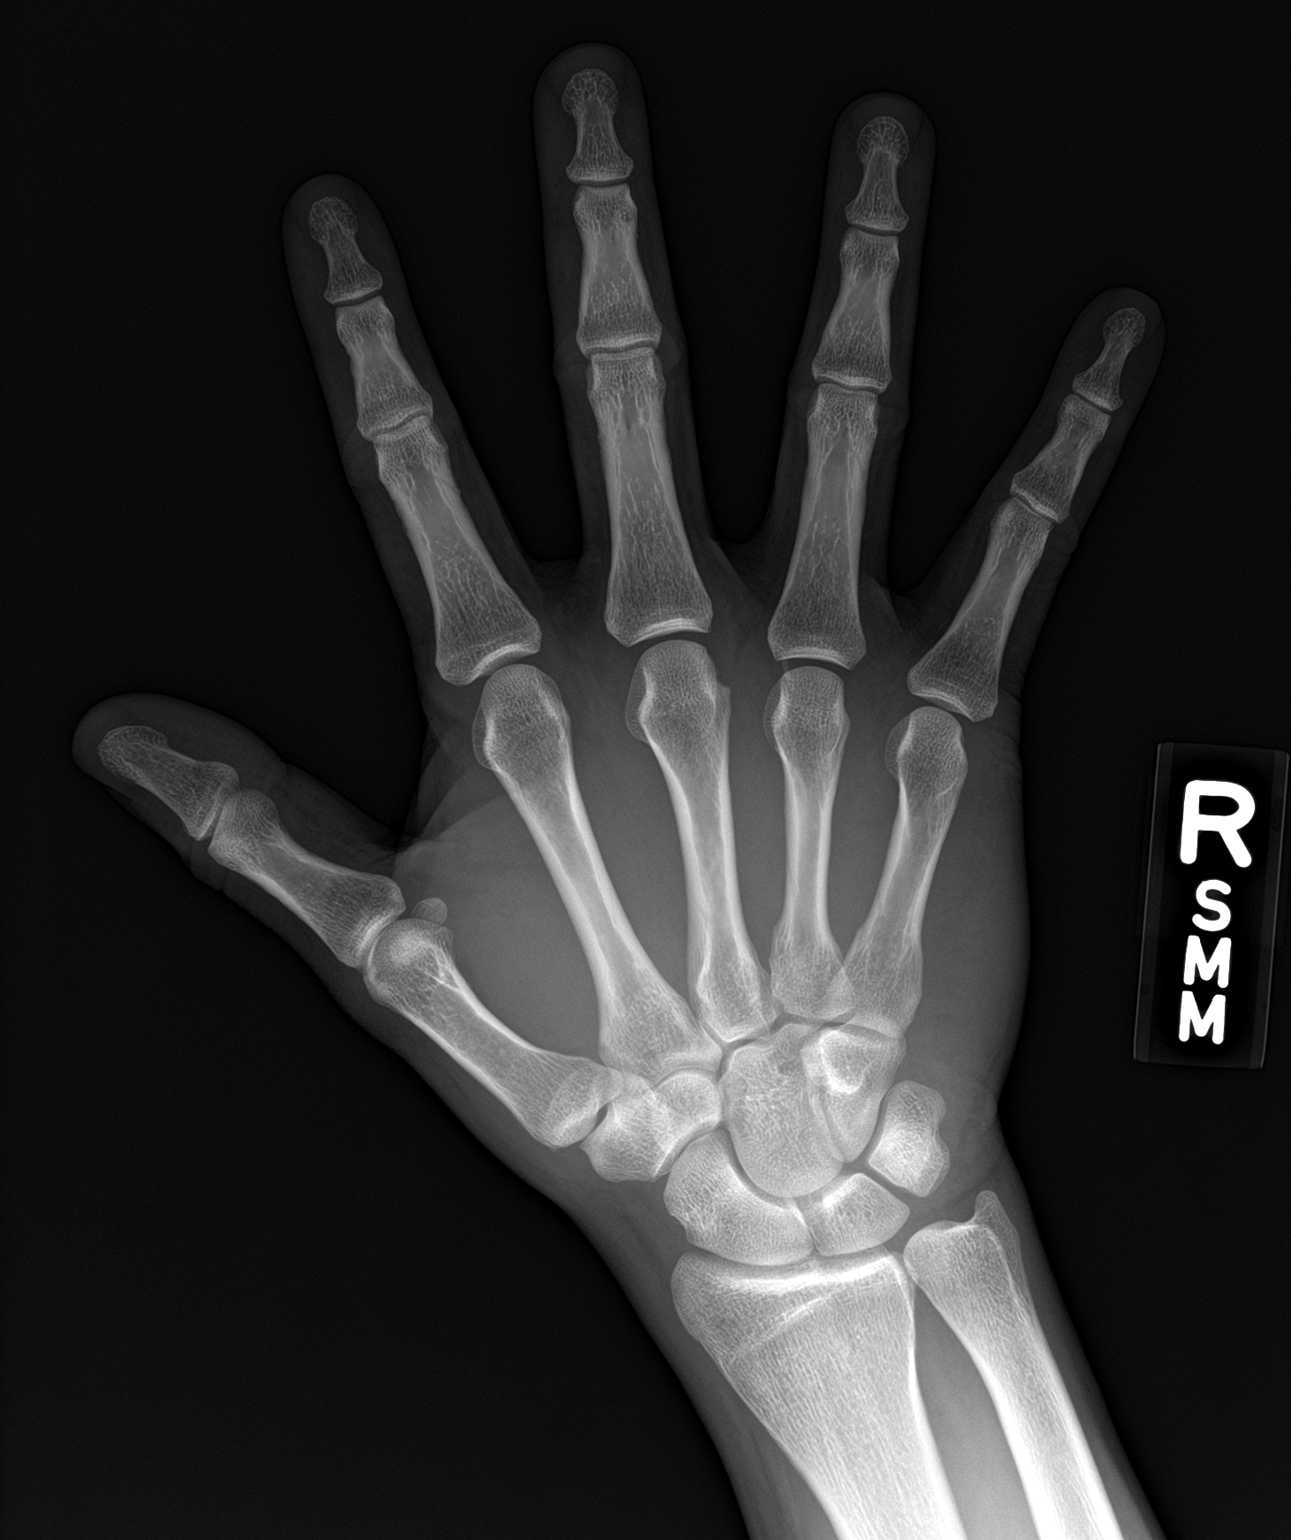

[hand obl]
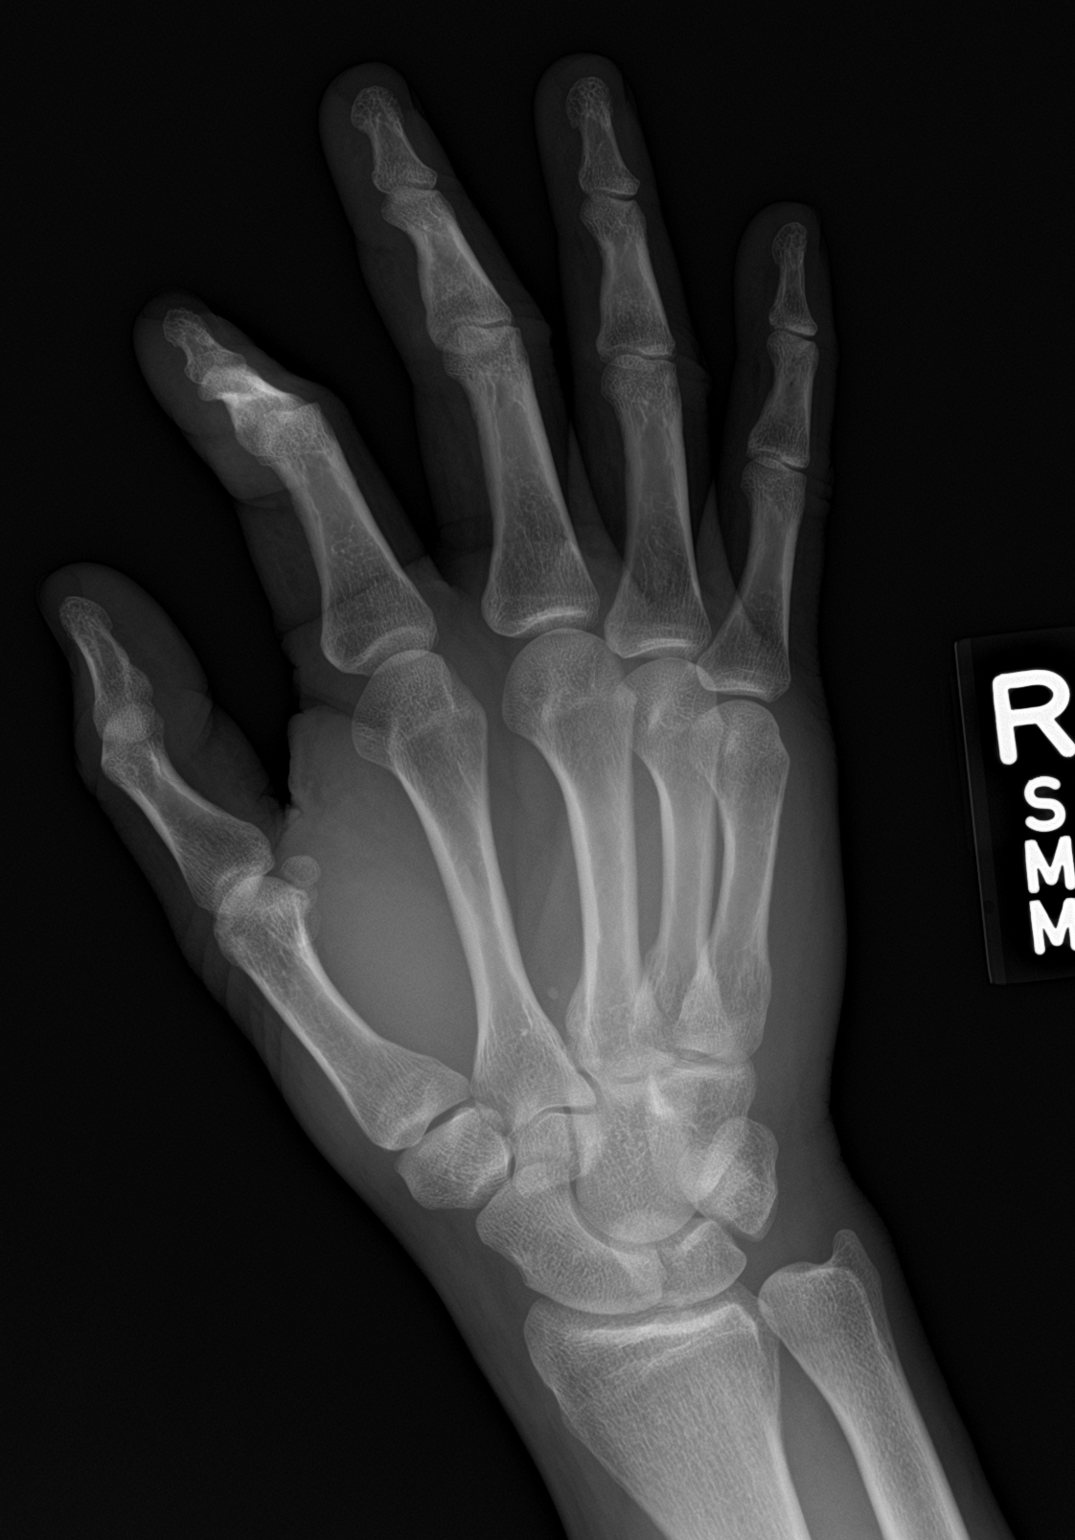

[hand lat]
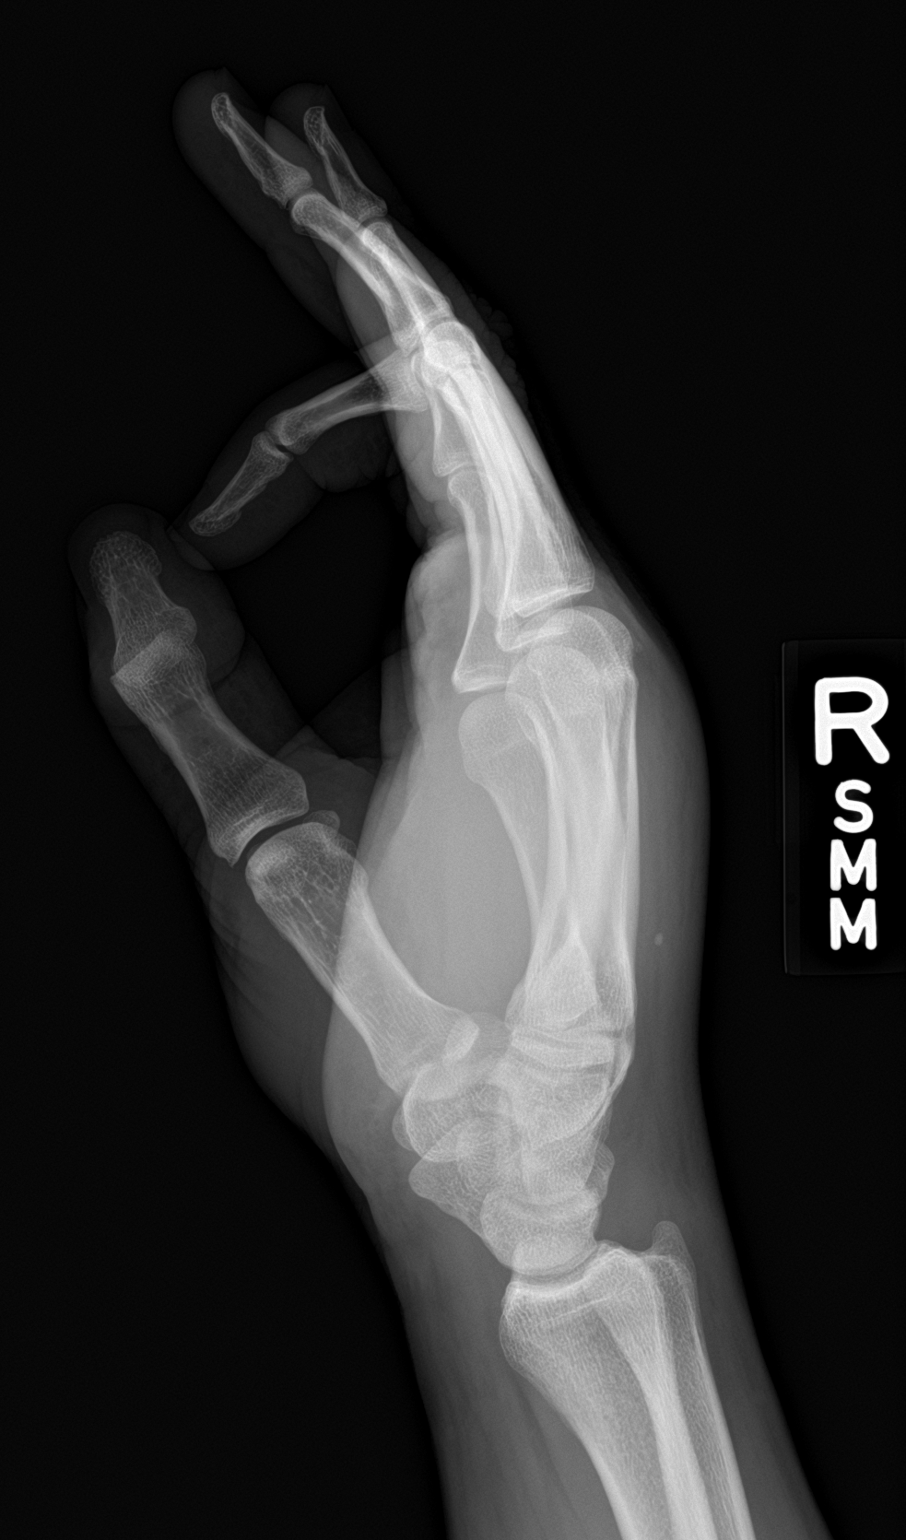

[3 of 3 positions shown; findings below may reference images not displayed]

FINDINGS: Soft tissue swelling within the hand. No acute bony abnormality.
Specifically, no fracture, subluxation, or dislocation. Joint spaces
maintained.
IMPRESSION: No acute bony abnormality.

## 2022-01-04 DIAGNOSIS — M542 Cervicalgia: Secondary | ICD-10-CM | POA: Insufficient documentation

## 2022-01-04 DIAGNOSIS — R519 Headache, unspecified: Secondary | ICD-10-CM | POA: Insufficient documentation

## 2022-01-04 DIAGNOSIS — S50811A Abrasion of right forearm, initial encounter: Secondary | ICD-10-CM | POA: Diagnosis not present

## 2022-01-04 DIAGNOSIS — S3991XA Unspecified injury of abdomen, initial encounter: Secondary | ICD-10-CM | POA: Diagnosis present

## 2022-01-04 DIAGNOSIS — S30811A Abrasion of abdominal wall, initial encounter: Secondary | ICD-10-CM | POA: Insufficient documentation

## 2022-01-04 DIAGNOSIS — Y92410 Unspecified street and highway as the place of occurrence of the external cause: Secondary | ICD-10-CM | POA: Insufficient documentation
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
## 2022-01-05 ENCOUNTER — Emergency Department (HOSPITAL_COMMUNITY): Payer: No Typology Code available for payment source

## 2022-01-05 ENCOUNTER — Other Ambulatory Visit: Payer: Self-pay

## 2022-01-05 ENCOUNTER — Encounter (HOSPITAL_COMMUNITY): Payer: Self-pay | Admitting: Emergency Medicine

## 2022-01-05 ENCOUNTER — Emergency Department (HOSPITAL_COMMUNITY)
Admission: EM | Admit: 2022-01-05 | Discharge: 2022-01-05 | Disposition: A | Discharge status: Home / Self Care | Payer: No Typology Code available for payment source | Attending: Emergency Medicine | Admitting: Emergency Medicine

## 2022-01-05 MED ORDER — LIDOCAINE 5 % EX PTCH
1.0000 | MEDICATED_PATCH | CUTANEOUS | Status: DC
  Filled 2022-01-05: qty 1

## 2022-01-05 MED ORDER — NAPROXEN 250 MG PO TABS: 500.0000 mg | ORAL_TABLET | Freq: Once | ORAL | Status: DC

## 2022-01-05 MED ORDER — LIDOCAINE 5 % EX PTCH: 1.0000 | MEDICATED_PATCH | Freq: Every day | CUTANEOUS | 0 refills | Status: AC | PRN

## 2022-01-05 MED ORDER — LIDOCAINE 5 % EX PTCH
2.0000 | MEDICATED_PATCH | CUTANEOUS | Status: DC
  Administered 2022-01-05: 2 via TRANSDERMAL
  Filled 2022-01-05: qty 2

## 2022-01-05 MED ORDER — METHOCARBAMOL 500 MG PO TABS: 500.0000 mg | ORAL_TABLET | Freq: Three times a day (TID) | ORAL | 0 refills | Status: AC | PRN

## 2022-01-05 MED ORDER — ACETAMINOPHEN 325 MG PO TABS
650.0000 mg | ORAL_TABLET | Freq: Once | ORAL | Status: AC
  Administered 2022-01-05: 650 mg via ORAL
  Filled 2022-01-05: qty 2
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
